# Patient Record
Sex: Female | Born: 1945
Health system: Southern US, Community
[De-identification: ages and names within clinical notes are randomized; demographics above are authoritative.]

## PROBLEM LIST (undated history)

## (undated) ENCOUNTER — Emergency Department (HOSPITAL_COMMUNITY)
Admission: EM | Disposition: A | Discharge status: Other Acute Inpatient Hospital | Payer: Medicare PPO | Attending: Surgery | Admitting: Surgery

## (undated) DIAGNOSIS — G43909 Migraine, unspecified, not intractable, without status migrainosus: Secondary | ICD-10-CM

## (undated) DIAGNOSIS — F419 Anxiety disorder, unspecified: Secondary | ICD-10-CM

## (undated) DIAGNOSIS — E785 Hyperlipidemia, unspecified: Secondary | ICD-10-CM

## (undated) DIAGNOSIS — M1990 Unspecified osteoarthritis, unspecified site: Secondary | ICD-10-CM

## (undated) DIAGNOSIS — C4490 Unspecified malignant neoplasm of skin, unspecified: Secondary | ICD-10-CM

## (undated) DIAGNOSIS — N6019 Diffuse cystic mastopathy of unspecified breast: Secondary | ICD-10-CM

## (undated) DIAGNOSIS — G4730 Sleep apnea, unspecified: Secondary | ICD-10-CM

## (undated) HISTORY — DX: Diffuse cystic mastopathy of unspecified breast: N60.19

## (undated) HISTORY — DX: Migraine, unspecified, not intractable, without status migrainosus: G43.909

## (undated) HISTORY — DX: Anxiety disorder, unspecified: F41.9

## (undated) HISTORY — DX: Hyperlipidemia, unspecified: E78.5

## (undated) HISTORY — PX: BREAST LUMPECTOMY: SHX2

## (undated) HISTORY — PX: TONSILLECTOMY: SUR1361

## (undated) HISTORY — DX: Sleep apnea, unspecified: G47.30

## (undated) HISTORY — DX: Unspecified malignant neoplasm of skin, unspecified: C44.90

## (undated) HISTORY — PX: KNEE SURGERY: SHX244

## (undated) HISTORY — DX: Unspecified osteoarthritis, unspecified site: M19.90

---

## 1997-06-26 HISTORY — PX: COLONOSCOPY: SHX174

## 1998-06-09 ENCOUNTER — Other Ambulatory Visit: Admission: RE | Admit: 1998-06-09 | Discharge: 1998-06-09 | Payer: Self-pay | Admitting: Obstetrics and Gynecology

## 1999-07-08 ENCOUNTER — Other Ambulatory Visit: Admission: RE | Admit: 1999-07-08 | Discharge: 1999-07-08 | Payer: Self-pay | Admitting: Obstetrics and Gynecology

## 1999-10-12 ENCOUNTER — Encounter: Payer: Self-pay | Admitting: *Deleted

## 1999-10-12 ENCOUNTER — Ambulatory Visit (HOSPITAL_COMMUNITY): Admission: RE | Admit: 1999-10-12 | Discharge: 1999-10-12 | Payer: Self-pay | Admitting: *Deleted

## 2000-04-06 ENCOUNTER — Encounter: Payer: Self-pay | Admitting: Internal Medicine

## 2000-04-06 ENCOUNTER — Ambulatory Visit (HOSPITAL_COMMUNITY): Admission: RE | Admit: 2000-04-06 | Discharge: 2000-04-06 | Payer: Self-pay | Admitting: Internal Medicine

## 2000-08-14 ENCOUNTER — Other Ambulatory Visit: Admission: RE | Admit: 2000-08-14 | Discharge: 2000-08-14 | Payer: Self-pay | Admitting: Obstetrics and Gynecology

## 2001-08-16 ENCOUNTER — Other Ambulatory Visit: Admission: RE | Admit: 2001-08-16 | Discharge: 2001-08-16 | Payer: Self-pay | Admitting: Obstetrics & Gynecology

## 2002-09-04 ENCOUNTER — Other Ambulatory Visit: Admission: RE | Admit: 2002-09-04 | Discharge: 2002-09-04 | Payer: Self-pay | Admitting: Obstetrics and Gynecology

## 2003-09-09 ENCOUNTER — Other Ambulatory Visit: Admission: RE | Admit: 2003-09-09 | Discharge: 2003-09-09 | Payer: Self-pay | Admitting: Obstetrics and Gynecology

## 2004-09-09 ENCOUNTER — Other Ambulatory Visit: Admission: RE | Admit: 2004-09-09 | Discharge: 2004-09-09 | Payer: Self-pay | Admitting: Obstetrics and Gynecology

## 2005-05-16 ENCOUNTER — Ambulatory Visit: Payer: Self-pay | Admitting: Internal Medicine

## 2005-10-04 ENCOUNTER — Other Ambulatory Visit: Admission: RE | Admit: 2005-10-04 | Discharge: 2005-10-04 | Payer: Self-pay | Admitting: Obstetrics and Gynecology

## 2005-10-12 ENCOUNTER — Ambulatory Visit: Payer: Self-pay | Admitting: Internal Medicine

## 2005-11-21 ENCOUNTER — Ambulatory Visit: Payer: Self-pay | Admitting: Internal Medicine

## 2006-02-09 ENCOUNTER — Ambulatory Visit: Payer: Self-pay | Admitting: Internal Medicine

## 2006-05-04 ENCOUNTER — Ambulatory Visit: Payer: Self-pay | Admitting: Internal Medicine

## 2006-06-26 HISTORY — PX: BREAST REDUCTION SURGERY: SHX8

## 2006-09-25 ENCOUNTER — Ambulatory Visit: Payer: Self-pay | Admitting: Internal Medicine

## 2006-09-25 LAB — CONVERTED CEMR LAB
ALT: 26 units/L (ref 0–40)
AST: 30 units/L (ref 0–37)
Cholesterol: 164 mg/dL (ref 0–200)
Glucose, Bld: 89 mg/dL (ref 70–99)
HDL: 56.6 mg/dL (ref >39.0)
LDL Cholesterol: 83 mg/dL (ref 0–99)
Total CHOL/HDL Ratio: 2.9
Triglycerides: 122 mg/dL (ref 0–149)
VLDL: 24 mg/dL (ref 0–40)

## 2006-12-17 ENCOUNTER — Telehealth (INDEPENDENT_AMBULATORY_CARE_PROVIDER_SITE_OTHER): Payer: Self-pay | Admitting: *Deleted

## 2006-12-25 ENCOUNTER — Ambulatory Visit: Payer: Self-pay | Admitting: Internal Medicine

## 2006-12-25 DIAGNOSIS — L259 Unspecified contact dermatitis, unspecified cause: Secondary | ICD-10-CM

## 2007-03-14 ENCOUNTER — Telehealth (INDEPENDENT_AMBULATORY_CARE_PROVIDER_SITE_OTHER): Payer: Self-pay | Admitting: *Deleted

## 2007-03-20 ENCOUNTER — Encounter: Payer: Self-pay | Admitting: Internal Medicine

## 2007-04-01 ENCOUNTER — Ambulatory Visit: Payer: Self-pay | Admitting: Internal Medicine

## 2007-04-01 DIAGNOSIS — E782 Mixed hyperlipidemia: Secondary | ICD-10-CM | POA: Insufficient documentation

## 2007-04-01 DIAGNOSIS — F329 Major depressive disorder, single episode, unspecified: Secondary | ICD-10-CM

## 2007-04-24 ENCOUNTER — Telehealth (INDEPENDENT_AMBULATORY_CARE_PROVIDER_SITE_OTHER): Payer: Self-pay | Admitting: *Deleted

## 2007-09-25 ENCOUNTER — Telehealth (INDEPENDENT_AMBULATORY_CARE_PROVIDER_SITE_OTHER): Payer: Self-pay | Admitting: *Deleted

## 2007-10-29 ENCOUNTER — Telehealth (INDEPENDENT_AMBULATORY_CARE_PROVIDER_SITE_OTHER): Payer: Self-pay | Admitting: *Deleted

## 2008-03-23 ENCOUNTER — Telehealth (INDEPENDENT_AMBULATORY_CARE_PROVIDER_SITE_OTHER): Payer: Self-pay | Admitting: *Deleted

## 2008-04-21 ENCOUNTER — Encounter: Payer: Self-pay | Admitting: Internal Medicine

## 2008-04-22 ENCOUNTER — Ambulatory Visit: Payer: Self-pay | Admitting: Internal Medicine

## 2008-04-22 DIAGNOSIS — J309 Allergic rhinitis, unspecified: Secondary | ICD-10-CM | POA: Insufficient documentation

## 2008-04-22 DIAGNOSIS — G43909 Migraine, unspecified, not intractable, without status migrainosus: Secondary | ICD-10-CM | POA: Insufficient documentation

## 2008-04-22 LAB — CONVERTED CEMR LAB
Cholesterol, target level: 200 mg/dL
HDL goal, serum: 50 mg/dL
LDL Goal: 100 mg/dL

## 2008-04-30 ENCOUNTER — Ambulatory Visit: Payer: Self-pay | Admitting: Internal Medicine

## 2008-05-02 LAB — CONVERTED CEMR LAB
ALT: 24 units/L (ref 0–35)
AST: 29 units/L (ref 0–37)
Albumin: 4 g/dL (ref 3.5–5.2)
Alkaline Phosphatase: 59 units/L (ref 39–117)
BUN: 19 mg/dL (ref 6–23)
Basophils Absolute: 0 10*3/uL (ref 0.0–0.1)
Basophils Relative: 0.1 % (ref 0.0–3.0)
Bilirubin, Direct: 0.1 mg/dL (ref 0.0–0.3)
CO2: 30 meq/L (ref 19–32)
Calcium: 9 mg/dL (ref 8.4–10.5)
Chloride: 106 meq/L (ref 96–112)
Cholesterol: 165 mg/dL (ref 0–200)
Creatinine, Ser: 0.8 mg/dL (ref 0.4–1.2)
Eosinophils Absolute: 0.2 10*3/uL (ref 0.0–0.7)
Eosinophils Relative: 3.4 % (ref 0.0–5.0)
GFR calc Af Amer: 93 mL/min
GFR calc non Af Amer: 77 mL/min
Glucose, Bld: 87 mg/dL (ref 70–99)
HCT: 40.3 % (ref 36.0–46.0)
HDL: 47.7 mg/dL (ref >39.0)
Hemoglobin: 14.2 g/dL (ref 12.0–15.0)
LDL Cholesterol: 91 mg/dL (ref 0–99)
Lymphocytes Relative: 35.9 % (ref 12.0–46.0)
MCHC: 35.2 g/dL (ref 30.0–36.0)
MCV: 97.5 fL (ref 78.0–100.0)
Monocytes Absolute: 0.5 10*3/uL (ref 0.1–1.0)
Monocytes Relative: 11.4 % (ref 3.0–12.0)
Neutro Abs: 2.3 10*3/uL (ref 1.4–7.7)
Neutrophils Relative %: 49.2 % (ref 43.0–77.0)
Platelets: 211 10*3/uL (ref 150–400)
Potassium: 4.3 meq/L (ref 3.5–5.1)
RBC: 4.13 M/uL (ref 3.87–5.11)
RDW: 12.1 % (ref 11.5–14.6)
Sodium: 143 meq/L (ref 135–145)
TSH: 1.63 microintl units/mL (ref 0.35–5.50)
Total Bilirubin: 0.7 mg/dL (ref 0.3–1.2)
Total CHOL/HDL Ratio: 3.5
Total Protein: 6.7 g/dL (ref 6.0–8.3)
Triglycerides: 130 mg/dL (ref 0–149)
VLDL: 26 mg/dL (ref 0–40)
WBC: 4.7 10*3/uL (ref 4.5–10.5)

## 2008-05-04 ENCOUNTER — Encounter (INDEPENDENT_AMBULATORY_CARE_PROVIDER_SITE_OTHER): Payer: Self-pay | Admitting: *Deleted

## 2008-07-02 ENCOUNTER — Ambulatory Visit: Payer: Self-pay | Admitting: Internal Medicine

## 2008-07-02 ENCOUNTER — Encounter (INDEPENDENT_AMBULATORY_CARE_PROVIDER_SITE_OTHER): Payer: Self-pay | Admitting: *Deleted

## 2008-07-02 LAB — CONVERTED CEMR LAB
OCCULT 1: NEGATIVE
OCCULT 2: NEGATIVE
OCCULT 3: NEGATIVE

## 2009-02-05 ENCOUNTER — Telehealth (INDEPENDENT_AMBULATORY_CARE_PROVIDER_SITE_OTHER): Payer: Self-pay | Admitting: *Deleted

## 2009-02-09 ENCOUNTER — Encounter: Payer: Self-pay | Admitting: Internal Medicine

## 2009-02-24 ENCOUNTER — Ambulatory Visit: Payer: Self-pay | Admitting: Internal Medicine

## 2009-04-13 ENCOUNTER — Telehealth (INDEPENDENT_AMBULATORY_CARE_PROVIDER_SITE_OTHER): Payer: Self-pay | Admitting: *Deleted

## 2009-04-14 ENCOUNTER — Encounter (INDEPENDENT_AMBULATORY_CARE_PROVIDER_SITE_OTHER): Payer: Self-pay | Admitting: *Deleted

## 2009-04-23 ENCOUNTER — Telehealth (INDEPENDENT_AMBULATORY_CARE_PROVIDER_SITE_OTHER): Payer: Self-pay | Admitting: *Deleted

## 2009-05-31 ENCOUNTER — Ambulatory Visit: Payer: Self-pay | Admitting: Internal Medicine

## 2009-05-31 DIAGNOSIS — L659 Nonscarring hair loss, unspecified: Secondary | ICD-10-CM | POA: Insufficient documentation

## 2009-06-08 ENCOUNTER — Encounter: Payer: Self-pay | Admitting: Internal Medicine

## 2009-06-17 ENCOUNTER — Ambulatory Visit: Payer: Self-pay | Admitting: Internal Medicine

## 2009-06-21 ENCOUNTER — Encounter (INDEPENDENT_AMBULATORY_CARE_PROVIDER_SITE_OTHER): Payer: Self-pay | Admitting: *Deleted

## 2009-06-21 LAB — CONVERTED CEMR LAB
ALT: 20 units/L (ref 0–35)
AST: 26 units/L (ref 0–37)
Albumin: 3.8 g/dL (ref 3.5–5.2)
Alkaline Phosphatase: 63 units/L (ref 39–117)
BUN: 12 mg/dL (ref 6–23)
Bilirubin, Direct: 0.1 mg/dL (ref 0.0–0.3)
CO2: 28 meq/L (ref 19–32)
Calcium: 9 mg/dL (ref 8.4–10.5)
Chloride: 106 meq/L (ref 96–112)
Cholesterol: 159 mg/dL (ref 0–200)
Creatinine, Ser: 0.8 mg/dL (ref 0.4–1.2)
GFR calc non Af Amer: 76.74 mL/min (ref >60)
Glucose, Bld: 90 mg/dL (ref 70–99)
HDL: 62.6 mg/dL (ref >39.00)
LDL Cholesterol: 77 mg/dL (ref 0–99)
Potassium: 3.9 meq/L (ref 3.5–5.1)
Sodium: 142 meq/L (ref 135–145)
TSH: 2.14 microintl units/mL (ref 0.35–5.50)
Total Bilirubin: 0.7 mg/dL (ref 0.3–1.2)
Total CHOL/HDL Ratio: 3
Total Protein: 6.5 g/dL (ref 6.0–8.3)
Triglycerides: 97 mg/dL (ref 0.0–149.0)
VLDL: 19.4 mg/dL (ref 0.0–40.0)

## 2009-06-22 ENCOUNTER — Ambulatory Visit: Payer: Self-pay | Admitting: Internal Medicine

## 2009-06-22 DIAGNOSIS — J019 Acute sinusitis, unspecified: Secondary | ICD-10-CM

## 2009-07-26 ENCOUNTER — Telehealth (INDEPENDENT_AMBULATORY_CARE_PROVIDER_SITE_OTHER): Payer: Self-pay | Admitting: *Deleted

## 2009-10-25 ENCOUNTER — Telehealth (INDEPENDENT_AMBULATORY_CARE_PROVIDER_SITE_OTHER): Payer: Self-pay | Admitting: *Deleted

## 2009-11-24 ENCOUNTER — Encounter: Payer: Self-pay | Admitting: Internal Medicine

## 2010-07-26 NOTE — Progress Notes (Signed)
Summary: Refill Request  Phone Note Refill Request Call back at [phone] Message from:  Pharmacy on Oct 25, 2009 8:54 AM  Refills Requested: Medication #1:  ATENOLOL 50 MG  TABS 1 by mouth qd   Dosage confirmed as above?Dosage Confirmed   Supply Requested: 3 months [facility]  Next Appointment Scheduled: none Initial call taken by: Harold Barban,  Oct 25, 2009 8:54 AM    Prescriptions: ATENOLOL 50 MG  TABS (ATENOLOL) 1 by mouth qd  #90 x 1   Entered by:   Shonna Chock   Authorized by:   Marga Melnick MD   Signed by:   Shonna Chock on 10/25/2009   Method used:   Faxed to ...       [facility]* (mail-order)             ,          Ph: [phone]       Fax: [phone]   RxID:   [id number]

## 2010-07-26 NOTE — Progress Notes (Signed)
Summary: REFILL  Phone Note Refill Request Message from:  Fax from Pharmacy on [facility] FAX [phone]  Refills Requested: Medication #1:  VYTORIN 10-40 MG  TABS 1 by mouth qd Initial call taken by: Barb Merino,  July 26, 2009 10:26 AM    Prescriptions: VYTORIN 10-40 MG  TABS (EZETIMIBE-SIMVASTATIN) 1 by mouth qd  #90 x 3   Entered by:   Shonna Chock   Authorized by:   Marga Melnick MD   Signed by:   Shonna Chock on 07/26/2009   Method used:   Faxed to ...       [facility]* (mail-order)             ,          Ph: [phone]       Fax: [phone]   RxID:   [id number]

## 2010-09-09 ENCOUNTER — Encounter: Payer: Self-pay | Admitting: Internal Medicine

## 2010-09-10 ENCOUNTER — Encounter: Payer: Self-pay | Admitting: Internal Medicine

## 2010-09-14 ENCOUNTER — Encounter: Payer: Self-pay | Admitting: Internal Medicine

## 2010-09-14 ENCOUNTER — Ambulatory Visit (INDEPENDENT_AMBULATORY_CARE_PROVIDER_SITE_OTHER): Payer: Medicare Other | Admitting: Internal Medicine

## 2010-09-14 VITALS — BP 100/64 | HR 80 | Ht 65.75 in | Wt 143.8 lb

## 2010-09-14 DIAGNOSIS — F419 Anxiety disorder, unspecified: Secondary | ICD-10-CM | POA: Insufficient documentation

## 2010-09-14 DIAGNOSIS — M79675 Pain in left toe(s): Secondary | ICD-10-CM

## 2010-09-14 DIAGNOSIS — Z8249 Family history of ischemic heart disease and other diseases of the circulatory system: Secondary | ICD-10-CM

## 2010-09-14 DIAGNOSIS — F411 Generalized anxiety disorder: Secondary | ICD-10-CM

## 2010-09-14 DIAGNOSIS — E785 Hyperlipidemia, unspecified: Secondary | ICD-10-CM

## 2010-09-14 DIAGNOSIS — Z0000 Encounter for general adult medical examination without abnormal findings: Secondary | ICD-10-CM

## 2010-09-14 DIAGNOSIS — M79609 Pain in unspecified limb: Secondary | ICD-10-CM

## 2010-09-14 MED ORDER — BUPROPION HCL ER (XL) 150 MG PO TB24: 150.0000 mg | ORAL_TABLET | Freq: Every day | ORAL | Status: DC

## 2010-09-14 MED ORDER — ALPRAZOLAM 0.25 MG PO TABS: 0.2500 mg | ORAL_TABLET | Freq: Every evening | ORAL | Status: AC | PRN

## 2010-09-14 NOTE — Progress Notes (Signed)
Subjective:    Patient ID: Dana Caldwell, female    DOB: 03/27/1946, 65 y.o.   MRN: [id number]  HPI  She is here for her Medicare wellness exam and medication refills. Her only symptom at this time is mild anxiety exacerbated by using her computer. She has had  intermittent pain in the left great toe; she questions an ingrown toenail.   The questions required for the Medicare wellness exam were completed in toto.    She will drink occasionally; she has never smoked. She is physically active walking 3-4 times a week. She also has been engaged in yoga. There are no limitations to activities of daily living. She has no significant balance issues. She denies significant depression. There were is no significant hearing or vision deficit issues.  She drinks one cup of coffee or tea a day often it is decaffeinated. Her most recent foreign travel was to [place] in November of 2010. She wears her seatbelt when driving or riding in the car. Smoke alarms are present in the house. Dental care is up to date. She is unsure as to the status of her colonoscopy . She has not had a bone density; Dr. Tenny Craw feels it is not necessary as she is on Evista. She believes that she may have been diagnosed with vitamin D deficiency.  Healthcare power of attorney and living will are in place.   Vision was intact based on reading a wall chart. Hearing was normal to whisper at 6 feet.   Orientation ; memory and recall; and mood and affect were normal. She was able to spell the word world backwards.    Review of Systems:  Pertinent review of systems was addressed in reference to her statin therapy. She denies fatigue; muscle pain; flushing; itching; pedal edema; chest pain; syncope; claudication; abdominal pain; or diarrhea. She does have occasional constipation.    She describes intermittent sleep disruption but no chronic insomnia.    [facility]. The triggers for  her migraines are weather changes.    She has had an extensive cardiology evaluation by Dr. Tresa Endo. She will request those records for review. She is unsure whether this included advance cholesterol testing. Her advanced cholesterol testing completed in 2007 was reviewed. At that time her LDL was 222 (3721 TOTAL PARTICLES; 2730 SMALL DENSE PARTICLES ) , HDL 52 , and triglycerides 176. LDL goal = < 115.     Objective:   Physical Exam  Constitutional: She is oriented to person, place, and time. She appears well-nourished.  HENT:  Head: Normocephalic.  Right Ear: External ear normal.  Left Ear: External ear normal.  Nose: Nose normal.  Mouth/Throat: Oropharynx is clear and moist.        Septal dislocation is present.  Eyes: Conjunctivae are normal. Pupils are equal, round, and reactive to light.  Neck: Neck supple. No thyromegaly present.  Pulmonary/Chest: Effort normal and breath sounds normal.  Abdominal: Soft. Bowel sounds are normal. She exhibits no mass. There is no tenderness.  Musculoskeletal: Normal range of motion. She exhibits no edema.  Lymphadenopathy:    She has no cervical adenopathy.  Neurological: She is alert and oriented to person, place, and time. She has normal reflexes.  Skin: Skin is dry.        The toenail of the left great toe is slightly ingrown. There is no active cellulitis present.  Psychiatric: She has a normal mood and affect.  Assessment & Plan:   #1 preventive health care exam reveals no significant acute issues.     #2 toe pain most likely related to ingrown toenail. Nail hygiene was discussed.   #3 anxiety in the context of using a computer( major issue in  computer malfunction at times)    #4 hyperlipidemia in the context of a family history of premature coronary artery disease. Additional advanced cholesterol testing Dana Caldwell to seize often hard panel 1304X) would be recommended if this has not been completed by Dr. Tresa Endo.     #5 possible  vitamin D deficiency    #6 unknown status as to colonoscopy surveillance status.

## 2010-09-14 NOTE — Patient Instructions (Signed)
If an advanced cholesterol test was not performed by Dr Tresa Endo; I would recommend the [facility] heart panel  be completed. Annual measurement of lipids and hepatic function (liver) would be indicated. If Dr. Tenny Craw has not checked your vitamin D level this would be indicated because of a history of possible vitamin D deficiency. You should be taking calcium 600 mg twice a day and at least 1000 international units of vitamin D 3 daily. Please contact Dr. Verlee Monte Caldwell's office to see when a follow up colonoscopy is due.

## 2010-09-21 ENCOUNTER — Encounter: Payer: Self-pay | Admitting: Internal Medicine

## 2011-01-12 ENCOUNTER — Other Ambulatory Visit: Payer: Self-pay | Admitting: Internal Medicine

## 2011-01-12 MED ORDER — EZETIMIBE-SIMVASTATIN 10-40 MG PO TABS: 1.0000 | ORAL_TABLET | Freq: Every day | ORAL | Status: DC

## 2011-01-12 NOTE — Telephone Encounter (Signed)
DUPLICATE

## 2011-01-12 NOTE — Telephone Encounter (Signed)
Called rx in, error in e-prescribing

## 2011-01-12 NOTE — Telephone Encounter (Signed)
Spoke with patient's husband and he indicated he thinks patient had cholesterol testing by [facility] and will have her contact them and fax results to our office.

## 2011-01-13 ENCOUNTER — Other Ambulatory Visit: Payer: BC Managed Care – PPO

## 2011-02-10 ENCOUNTER — Other Ambulatory Visit: Payer: Self-pay | Admitting: Internal Medicine

## 2011-02-10 DIAGNOSIS — E785 Hyperlipidemia, unspecified: Secondary | ICD-10-CM

## 2011-02-13 ENCOUNTER — Other Ambulatory Visit (INDEPENDENT_AMBULATORY_CARE_PROVIDER_SITE_OTHER): Payer: Medicare Other

## 2011-02-13 DIAGNOSIS — E785 Hyperlipidemia, unspecified: Secondary | ICD-10-CM

## 2011-02-13 LAB — HEPATIC FUNCTION PANEL
ALT: 25 U/L (ref 0–35)
Total Bilirubin: 0.5 mg/dL (ref 0.3–1.2)
Total Protein: 6.7 g/dL (ref 6.0–8.3)

## 2011-02-13 NOTE — Progress Notes (Signed)
Labs only

## 2011-03-08 ENCOUNTER — Encounter: Payer: Self-pay | Admitting: Internal Medicine

## 2011-03-21 ENCOUNTER — Ambulatory Visit (INDEPENDENT_AMBULATORY_CARE_PROVIDER_SITE_OTHER): Payer: Medicare Other | Admitting: Internal Medicine

## 2011-03-21 ENCOUNTER — Encounter: Payer: Self-pay | Admitting: Internal Medicine

## 2011-03-21 DIAGNOSIS — G4730 Sleep apnea, unspecified: Secondary | ICD-10-CM

## 2011-03-21 DIAGNOSIS — Z8249 Family history of ischemic heart disease and other diseases of the circulatory system: Secondary | ICD-10-CM

## 2011-03-21 DIAGNOSIS — E782 Mixed hyperlipidemia: Secondary | ICD-10-CM

## 2011-03-21 NOTE — Progress Notes (Signed)
  Subjective:    Patient ID: Dana Caldwell, female    DOB: March 27, 1946, 65 y.o.   MRN: [id number]  HPI Dyslipidemia assessment: Prior Advanced Lipid Testing:  ? NMR Lipoprofile in past, no copy in old chart.   Family history of premature CAD/ MI: 2 brothers had MI (12 & 23). P uncle MI > 55 .  Nutrition: no plan .  Exercise: yoga, walking up to 5 mpd 4-5X/ week . Diabetes : no, A1c 5.3% . HTN: no. Smoking history  : never .   Weight :  Stable Thyroid status: TSH 1.56.  Lab results reviewed :[facility] Heart Panel: not hyperabsorber;LDL 95;HDL 75;small dense LDL 28 (ideal < 20);Lp(a) 55 (ideal < 20)    Review of Systems ROS: fatigue: no ; chest pain : no ;claudication: no; palpitations: no; abd pain/bowel changes: occasional constipation ; myalgias:no;  syncope : no ; memory loss: no;skin changes: no.     Objective:   Physical Exam General appearance is one of good health and nourishment w/o distress.  Eyes: No conjunctival inflammation ; fundi normal  Neck: thyroid normal  Heart:  Normal rate and regular rhythm. S1 and S2 normal without gallop, murmur, click, rub .S4  Lungs:Chest clear to auscultation; no wheezes, rhonchi,rales ,or rubs present.No increased work of breathing.   Abdomen: bowel sounds normal, soft and non-tender without masses, organomegaly or hernias noted.  No AAA or bruits  Skin:Warm & dry.  Intact without suspicious lesions or rashes   Post traumatic changes 3 rd L toe nail   Neuro/Psych: focused & intelligent             Assessment & Plan:  #1 dyslipidemia; risks outlined above. Major risk is the Lp(a) of 55. Niacin (non-flush free) 500 mg would be only option  #2 sleep apnea, prescription for supplies.

## 2011-03-21 NOTE — Patient Instructions (Signed)
The Lp(a) is a genetic risk for premature heart disease which is not associated with LDL risk factors. The only option to treat this would be niacin. This B vitamin is a vasodilator and would be expected to open up the blood vessels and cause flushing. This can be prevented by taking it with several tablespoons of apple sauce. The pectin in applesauce blocks the flushing. There are prescription forms of niacin; check with your insurance company to see whether these are options based on cost  & coverage.

## 2011-03-22 ENCOUNTER — Ambulatory Visit: Payer: BC Managed Care – PPO | Admitting: Internal Medicine

## 2011-03-24 ENCOUNTER — Telehealth: Payer: Self-pay | Admitting: Internal Medicine

## 2011-03-24 MED ORDER — NIACIN ER (ANTIHYPERLIPIDEMIC) 500 MG PO TBCR: EXTENDED_RELEASE_TABLET | ORAL | Status: DC

## 2011-03-24 NOTE — Telephone Encounter (Signed)
Niaspan 500 mg each evening with apple sauce as directed. #90, RX 1

## 2011-03-24 NOTE — Telephone Encounter (Signed)
Dr.Hopper please advise, Niaspan was recommended at last OV, patient checked with insurance company.

## 2011-03-24 NOTE — Telephone Encounter (Signed)
Please take me out of your list ---I/m getting everything you send out---thanks

## 2011-03-24 NOTE — Telephone Encounter (Signed)
RX sent

## 2011-03-24 NOTE — Telephone Encounter (Signed)
Patient did not have NMR lipid profile, patient is requesting Niaspan 90 day supply to go to [facility]

## 2011-04-26 ENCOUNTER — Encounter: Payer: BC Managed Care – PPO | Admitting: Internal Medicine

## 2011-04-27 ENCOUNTER — Encounter: Payer: Self-pay | Admitting: Internal Medicine

## 2011-04-30 ENCOUNTER — Other Ambulatory Visit: Payer: Self-pay | Admitting: Internal Medicine

## 2011-06-27 HISTORY — PX: COLONOSCOPY W/ POLYPECTOMY: SHX1380

## 2011-09-27 ENCOUNTER — Other Ambulatory Visit: Payer: Self-pay | Admitting: Internal Medicine

## 2011-09-27 NOTE — Telephone Encounter (Signed)
Error

## 2011-09-27 NOTE — Telephone Encounter (Signed)
Refill for Niacin (Antihyperlipidemic) (Tab CR) NIASPAN 500 MG Daily at bedtime with applesauce (low-fat snack) as directed Patient took mail order bottle to pharmacy & had pharmacy call here. Please send to [facility] on [address]

## 2011-10-02 ENCOUNTER — Encounter: Payer: Self-pay | Admitting: Internal Medicine

## 2011-10-02 ENCOUNTER — Telehealth: Payer: Self-pay | Admitting: Internal Medicine

## 2011-10-02 MED ORDER — NIACIN ER (ANTIHYPERLIPIDEMIC) 500 MG PO TBCR: EXTENDED_RELEASE_TABLET | ORAL | Status: DC

## 2011-10-02 NOTE — Telephone Encounter (Signed)
RX sent

## 2011-10-02 NOTE — Telephone Encounter (Signed)
Patient would like refill of niaspan [facility] to [facility] on [address].  Cell [phone]

## 2011-11-02 ENCOUNTER — Ambulatory Visit (AMBULATORY_SURGERY_CENTER): Payer: Medicare Other | Admitting: *Deleted

## 2011-11-02 VITALS — Ht 66.0 in | Wt 144.6 lb

## 2011-11-02 DIAGNOSIS — Z1211 Encounter for screening for malignant neoplasm of colon: Secondary | ICD-10-CM

## 2011-11-02 MED ORDER — PEG-KCL-NACL-NASULF-NA ASC-C 100 G PO SOLR: ORAL | Status: DC

## 2011-11-16 ENCOUNTER — Ambulatory Visit (AMBULATORY_SURGERY_CENTER): Payer: Medicare Other | Admitting: Internal Medicine

## 2011-11-16 ENCOUNTER — Encounter: Payer: Self-pay | Admitting: Internal Medicine

## 2011-11-16 VITALS — BP 134/62 | HR 85 | Temp 97.4°F | Resp 18 | Ht 66.0 in | Wt 144.0 lb

## 2011-11-16 DIAGNOSIS — K635 Polyp of colon: Secondary | ICD-10-CM

## 2011-11-16 DIAGNOSIS — D126 Benign neoplasm of colon, unspecified: Secondary | ICD-10-CM

## 2011-11-16 DIAGNOSIS — Z1211 Encounter for screening for malignant neoplasm of colon: Secondary | ICD-10-CM

## 2011-11-16 MED ORDER — SODIUM CHLORIDE 0.9 % IV SOLN: 500.0000 mL | INTRAVENOUS | Status: DC

## 2011-11-16 NOTE — Patient Instructions (Signed)
YOU HAD AN ENDOSCOPIC PROCEDURE TODAY AT THE [facility]: Refer to the procedure report that was given to you for any specific questions about what was found during the examination.  If the procedure report does not answer your questions, please call your gastroenterologist to clarify.  If you requested that your care partner not be given the details of your procedure findings, then the procedure report has been included in a sealed envelope for you to review at your convenience later.  YOU SHOULD EXPECT: Some feelings of bloating in the abdomen. Passage of more gas than usual.  Walking can help get rid of the air that was put into your GI tract during the procedure and reduce the bloating. If you had a lower endoscopy (such as a colonoscopy or flexible sigmoidoscopy) you may notice spotting of blood in your stool or on the toilet paper. If you underwent a bowel prep for your procedure, then you may not have a normal bowel movement for a few days.  DIET: Your first meal following the procedure should be a light meal and then it is ok to progress to your normal diet.  A half-sandwich or bowl of soup is an example of a good first meal.  Heavy or fried foods are harder to digest and may make you feel nauseous or bloated.  Likewise meals heavy in dairy and vegetables can cause extra gas to form and this can also increase the bloating.  Drink plenty of fluids but you should avoid alcoholic beverages for 24 hours.  ACTIVITY: Your care partner should take you home directly after the procedure.  You should plan to take it easy, moving slowly for the rest of the day.  You can resume normal activity the day after the procedure however you should NOT DRIVE or use heavy machinery for 24 hours (because of the sedation medicines used during the test).    SYMPTOMS TO REPORT IMMEDIATELY: A gastroenterologist can be reached at any hour.  During normal business hours, 8:30 AM to 5:00 PM Monday through Friday,  call [phone].  After hours and on weekends, please call the GI answering service at [phone] who will take a message and have the physician on call contact you.   Following lower endoscopy (colonoscopy or flexible sigmoidoscopy):  Excessive amounts of blood in the stool  Significant tenderness or worsening of abdominal pains  Swelling of the abdomen that is new, acute  Fever of 100F or higher   FOLLOW UP: If any biopsies were taken you will be contacted by phone or by letter within the next 1-3 weeks.  Call your gastroenterologist if you have not heard about the biopsies in 3 weeks.  Our staff will call the home number listed on your records the next business day following your procedure to check on you and address any questions or concerns that you may have at that time regarding the information given to you following your procedure. This is a courtesy call and so if there is no answer at the home number and we have not heard from you through the emergency physician on call, we will assume that you have returned to your regular daily activities without incident.  SIGNATURES/CONFIDENTIALITY: You and/or your care partner have signed paperwork which will be entered into your electronic medical record.  These signatures attest to the fact that that the information above on your After Visit Summary has been reviewed and is understood.  Full responsibility of the confidentiality of   this discharge information lies with you and/or your care-partner.   Resume medications. Information given on polyps and high fiber diet with discharge instructions. 

## 2011-11-16 NOTE — Progress Notes (Signed)
Patient did not experience any of the following events: a burn prior to discharge; a fall within the facility; wrong site/side/patient/procedure/implant event; or a hospital transfer or hospital admission upon discharge from the facility. (G8907) Patient did not have preoperative order for IV antibiotic SSI prophylaxis. (G8918)  

## 2011-11-16 NOTE — Progress Notes (Signed)
Patient questioning medication dosages on AVS. Explained to her that I was aware of her discussion with me that some of the medications were taken as needed, and I charted when they were last taken. If I changed how she takes them in the computer, it changes the prescription and availability of medicines to her. She understands the difference of how the prescription is written and transmitted and how the medicines may be advised to be taken by her provider.

## 2011-11-16 NOTE — Op Note (Signed)
[facility] [address] [place], [place]  [address]  COLONOSCOPY PROCEDURE REPORT  PATIENT:  Dana Caldwell, Dana Caldwell  MR#:  [id number] BIRTHDATE:  06-May-1946, 65 yrs. old  GENDER:  female ENDOSCOPIST:  Hedwig Morton. Juanda Chance, MD REF. BY:  Marga Melnick, M.D. PROCEDURE DATE:  11/16/2011 PROCEDURE:  Colonoscopy with biopsy and snare polypectomy ASA CLASS:  Class II INDICATIONS:  Routine Risk Screening last colon 1999 was normal MEDICATIONS:   MAC sedation, administered by CRNA, propofol (Diprivan) 200 mg  DESCRIPTION OF PROCEDURE:   After the risks and benefits and of the procedure were explained, informed consent was obtained. Digital rectal exam was performed and revealed no rectal masses. The LB [id number]-[id number] endoscope was introduced through the anus and advanced to the cecum, which was identified by both the appendix and ileocecal valve.  The quality of the prep was good, using MoviPrep.  The instrument was then slowly withdrawn as the colon was fully examined. <<PROCEDUREIMAGES>>  FINDINGS:  A sessile polyp was found. 5 mm sessile polyp distal from the ileocecal valve Polyp was snared without cautery. Retrieval was successful (see image3). snare polyp postpolyp bleeding stasis achived with hot biopsies  This was otherwise a normal examination of the colon (see image4, image3, image2, and image1).   Retroflexed views in the rectum revealed no abnormalities.    The scope was then withdrawn from the patient and the procedure completed.  COMPLICATIONS:  None ENDOSCOPIC IMPRESSION: 1) Sessile polyp 2) Otherwise normal examination RECOMMENDATIONS: 1) Await pathology results 2) High fiber diet.  REPEAT EXAM:  In 5 year(s) for.  ______________________________ Hedwig Morton. Juanda Chance, MD  CC:  n. eSIGNED:   Hedwig Morton. Alecsander Hattabaugh at 11/16/2011 10:32 AM  Laruth Bouchard, [phone]

## 2011-11-17 ENCOUNTER — Telehealth: Payer: Self-pay | Admitting: *Deleted

## 2011-11-17 NOTE — Telephone Encounter (Signed)
  Follow up Call-  Call back number 11/16/2011  Post procedure Call Back phone  # [phone]  Permission to leave phone message Yes     Left message.

## 2011-11-23 ENCOUNTER — Encounter: Payer: Self-pay | Admitting: Internal Medicine

## 2012-03-22 ENCOUNTER — Ambulatory Visit (INDEPENDENT_AMBULATORY_CARE_PROVIDER_SITE_OTHER): Payer: Medicare Other | Admitting: Internal Medicine

## 2012-03-22 ENCOUNTER — Encounter: Payer: Self-pay | Admitting: Internal Medicine

## 2012-03-22 ENCOUNTER — Other Ambulatory Visit: Payer: Self-pay | Admitting: Internal Medicine

## 2012-03-22 VITALS — BP 110/68 | HR 86 | Temp 98.3°F | Wt 136.8 lb

## 2012-03-22 DIAGNOSIS — Z23 Encounter for immunization: Secondary | ICD-10-CM

## 2012-03-22 DIAGNOSIS — L03019 Cellulitis of unspecified finger: Secondary | ICD-10-CM

## 2012-03-22 DIAGNOSIS — L0390 Cellulitis, unspecified: Secondary | ICD-10-CM

## 2012-03-22 DIAGNOSIS — L0291 Cutaneous abscess, unspecified: Secondary | ICD-10-CM

## 2012-03-22 MED ORDER — TRAMADOL HCL 50 MG PO TABS: 50.0000 mg | ORAL_TABLET | Freq: Four times a day (QID) | ORAL | Status: DC | PRN

## 2012-03-22 MED ORDER — CLARITHROMYCIN ER 500 MG PO TB24: 1000.0000 mg | ORAL_TABLET | Freq: Every day | ORAL | Status: DC

## 2012-03-22 MED ORDER — MUPIROCIN 2 % EX OINT: TOPICAL_OINTMENT | CUTANEOUS | Status: DC

## 2012-03-22 NOTE — Progress Notes (Signed)
  Subjective:    Patient ID: Dana Caldwell, female    DOB: 1946/01/17, 66 y.o.   MRN: [id number]  HPI Symptoms began 03/13/12 as pain at the distal left index finger after having her nails done. The pain began within 24 hours of the manicure. Subsequently she had increasing swelling and pain which necessitated being seen 9/21 in an  urgent care out of town. She was placed on doxycycline as well with Hibiclens sterilization twice a day.  She had treated this herself with alcohol, hydrogen peroxide, Neosporin, and Mercurochrome.      Review of Systems She denies fever, chills, sweats, purulent drainage or cellulitis     Objective:   Physical Exam  She appears healthy and well-nourished; she is uncomfortable due to 2 pain index finger  There is no lymphadenopathy about the neck, axilla, or epitrochlear areas.  Radial artery pulses are excellent  And abscess 8 x 11 mm is present along the radial aspect of the left index fingernail. No evidence of cellulitis  proximal to the  DIP was present  The abscess was cleansed with Betadine and alcohol. Sterile needle was used to open the wound. A collection of pus was obtained followed by some serosanguineous material.        Assessment & Plan:   #1 paronychia with abscess  Plan: See orders & recommendations

## 2012-03-22 NOTE — Telephone Encounter (Signed)
I called pharmacy back and left message informing them rx was sent to [address] [facility] in error ( system to be reviewed and rx's to be transferred), if unable to transfer I left the rx's to be filled (see med list)

## 2012-03-22 NOTE — Patient Instructions (Addendum)
Please do not take Cafergot or Vytorin while you're on the clarithromycin. Soak finger twice a day in saline. The saline can be purchased at the drugstore or you can make your own .Boil cup of salt in a gallon of water. Store mixture  in a clean container.Report Warning  signs as discussed (red streaks, pus, fever, increasing pain).

## 2012-03-22 NOTE — Telephone Encounter (Signed)
Pt has told pharm that a proscription should have arrived at the [facility] on [address]. Pharm calling they haven't received anything and are not sure what prescription she is talking about. They would like you to call them back at [phone]

## 2012-03-25 LAB — WOUND CULTURE: Gram Stain: NONE SEEN

## 2012-04-11 ENCOUNTER — Other Ambulatory Visit: Payer: Self-pay | Admitting: Internal Medicine

## 2012-04-26 ENCOUNTER — Ambulatory Visit (INDEPENDENT_AMBULATORY_CARE_PROVIDER_SITE_OTHER): Payer: Medicare Other | Admitting: Internal Medicine

## 2012-04-26 ENCOUNTER — Encounter: Payer: Self-pay | Admitting: Internal Medicine

## 2012-04-26 VITALS — BP 100/70 | HR 71 | Temp 98.0°F | Resp 12 | Ht 65.08 in | Wt 133.8 lb

## 2012-04-26 DIAGNOSIS — Z23 Encounter for immunization: Secondary | ICD-10-CM

## 2012-04-26 DIAGNOSIS — Z0000 Encounter for general adult medical examination without abnormal findings: Secondary | ICD-10-CM

## 2012-04-26 DIAGNOSIS — E785 Hyperlipidemia, unspecified: Secondary | ICD-10-CM

## 2012-04-26 DIAGNOSIS — D126 Benign neoplasm of colon, unspecified: Secondary | ICD-10-CM

## 2012-04-26 DIAGNOSIS — E782 Mixed hyperlipidemia: Secondary | ICD-10-CM

## 2012-04-26 DIAGNOSIS — K635 Polyp of colon: Secondary | ICD-10-CM | POA: Insufficient documentation

## 2012-04-26 DIAGNOSIS — E559 Vitamin D deficiency, unspecified: Secondary | ICD-10-CM | POA: Insufficient documentation

## 2012-04-26 LAB — BASIC METABOLIC PANEL
BUN: 16 mg/dL (ref 6–23)
Calcium: 8.9 mg/dL (ref 8.4–10.5)
Chloride: 104 mEq/L (ref 96–112)
Creatinine, Ser: 0.7 mg/dL (ref 0.4–1.2)

## 2012-04-26 LAB — LIPID PANEL
HDL: 93.1 mg/dL (ref >39.00)
LDL Cholesterol: 60 mg/dL (ref 0–99)
Total CHOL/HDL Ratio: 2
Triglycerides: 63 mg/dL (ref 0.0–149.0)
VLDL: 12.6 mg/dL (ref 0.0–40.0)

## 2012-04-26 LAB — CBC WITH DIFFERENTIAL/PLATELET
Eosinophils Absolute: 0.1 10*3/uL (ref 0.0–0.7)
Lymphocytes Relative: 38 % (ref 12.0–46.0)
MCHC: 33.4 g/dL (ref 30.0–36.0)
MCV: 96.7 fl (ref 78.0–100.0)
Monocytes Absolute: 0.3 10*3/uL (ref 0.1–1.0)
Neutrophils Relative %: 49.8 % (ref 43.0–77.0)
Platelets: 209 10*3/uL (ref 150.0–400.0)
RBC: 4.38 Mil/uL (ref 3.87–5.11)
WBC: 4 10*3/uL — ABNORMAL LOW (ref 4.5–10.5)

## 2012-04-26 LAB — HEPATIC FUNCTION PANEL
Alkaline Phosphatase: 58 U/L (ref 39–117)
Bilirubin, Direct: 0.1 mg/dL (ref 0.0–0.3)
Total Bilirubin: 0.5 mg/dL (ref 0.3–1.2)

## 2012-04-26 LAB — TSH: TSH: 0.41 u[IU]/mL (ref 0.35–5.50)

## 2012-04-26 NOTE — Patient Instructions (Addendum)
Preventive Health Care: Eat a low-fat diet with lots of fruits and vegetables, up to 7-9 servings per day. Consume less than 30 grams of sugar per day from foods & drinks with High Fructose Corn Syrup as #1,2,3 or #4 on label.  If you activate My Chart; the results can be released to you as soon as they populate from the lab. If you choose not to use this program; the labs have to be reviewed, copied & mailed   causing a delay in getting the results to you.

## 2012-04-26 NOTE — Progress Notes (Signed)
Subjective:    Patient ID: Dana Caldwell, female    DOB: 03-02-1946, 66 y.o.   MRN: [id number]  HPI Medicare Wellness Visit:  The following psychosocial & medical history were reviewed as required by Medicare.   Social history: caffeine: minimal , alcohol:  rarely,  tobacco use :never  & exercise : walking > 4 mpd 4X/ week.   Home & personal  safety / fall risk: see screen, activities of daily living: no limitations , seatbelt use : yes , and smoke alarm employment :yes .  Power of Attorney/Living Will status : in place  Vision ( as recorded per Nurse) & Hearing  evaluation : Ophth exam > 12 mos. No hearing exam. Orientation :oriented X 3 , memory & recall :good, spelling  testing: good,and mood & affect : normal . Depression / anxiety: denied Travel history : 2011 [place] , immunization status :Shingles needed , transfusion history: no, and preventive health surveillance ( colonoscopies, BMD , etc as per protocol/ [facility]): colonoscopy up to date, Dental care:  Every 6 mos . Chart reviewed &  Updated. Active issues reviewed & addressed.       Review of Systems    Exercise as noted above; she is on a heart healthy diet. She has no chest pain, palpitations, dyspnea, or claudication with exercise. She is compliant with her statin. She denies abdominal pain, bowel changes, or myalgias.  She has been seen at the headache clinic; her migraine regimen mus be adjusted as Cafergot which she's used in the past is not available, unless she has it compounded. She previously had taken Topamax but this had caused some decreased mental alertness. The neurologist questioned increase risk of stroke with triple therapy. There is family history of cerebrovascular accident.     Objective:   Physical Exam Gen.: Thin but healthy and well-nourished in appearance. Alert, appropriate and cooperative throughout exam. Head: Normocephalic without obvious abnormalities  Eyes: No corneal or conjunctival inflammation noted.   Extraocular motion intact. Vision grossly normal with lenses. Ears: External  ear exam reveals no significant lesions or deformities. Wax on R. L TM normal. Hearing is grossly normal bilaterally. Nose: External nasal exam reveals no deformity or inflammation. Nasal mucosa are pink and moist. No lesions or exudates noted. Septum slightly dislocated Mouth: Oral mucosa and oropharynx reveal no lesions or exudates. Teeth in good repair. Neck: No deformities, masses, or tenderness noted. Range of motion decreased. Thyroid  normal Lungs: Normal respiratory effort; chest expands symmetrically. Lungs are clear to auscultation without rales, wheezes, or increased work of breathing. Heart: Normal rate and rhythm. Normal S1 and S2. No gallop, click, or rub. Grade 1/2-1 over 6 systolic murmur . Abdomen: Bowel sounds normal; abdomen soft and nontender. No masses, organomegaly or hernias noted.Aorta palpable ; no AAA  Genitalia: Dr Tenny Craw                                               Musculoskeletal/extremities: No deformity or scoliosis noted of  the thoracic or lumbar spine. No clubbing, cyanosis, edema, or deformity noted. Range of motion  normal .Tone & strength  normal.Joints normal. Nail health  good. Vascular: Carotid, radial artery, dorsalis pedis and  posterior tibial pulses are full and equal. No bruits present. Neurologic: Alert and oriented x3. Deep tendon reflexes symmetrical and normal.  Skin: Intact without suspicious lesions or rashes. Lymph: No cervical, axillary lymphadenopathy present. Psych: Mood and affect are normal. Normally interactive                                                                                         Assessment & Plan:  #1 Medicare Wellness Exam; criteria met ; data entered #2 migraine regimen as per Dr Neale Burly Plan: see Orders

## 2012-04-29 ENCOUNTER — Other Ambulatory Visit: Payer: Self-pay | Admitting: Internal Medicine

## 2012-05-04 ENCOUNTER — Other Ambulatory Visit: Payer: Self-pay | Admitting: Internal Medicine

## 2012-05-19 ENCOUNTER — Other Ambulatory Visit: Payer: Self-pay | Admitting: Internal Medicine

## 2012-06-03 ENCOUNTER — Telehealth: Payer: Self-pay | Admitting: Internal Medicine

## 2012-06-03 NOTE — Telephone Encounter (Signed)
Caller: Millisa/Patient; Phone: [phone]; Reason for Call: Called to let Dr Alwyn Ren know that dentist, Dr Irene Limbo, DDS will be following her with new Moses appliance, instead of C-PAP machine.

## 2012-06-07 ENCOUNTER — Telehealth: Payer: Self-pay | Admitting: *Deleted

## 2012-06-07 MED ORDER — ZOSTER VACCINE LIVE 19400 UNT/0.65ML ~~LOC~~ SOLR: 0.6500 mL | Freq: Once | SUBCUTANEOUS | Status: DC

## 2012-06-07 NOTE — Telephone Encounter (Signed)
Ok

## 2012-06-07 NOTE — Telephone Encounter (Signed)
RX sent

## 2012-06-07 NOTE — Telephone Encounter (Signed)
Pt request a Rx for shingle vaccine to be sent in to pharmacy.

## 2012-06-21 ENCOUNTER — Encounter: Payer: Self-pay | Admitting: Internal Medicine

## 2012-09-23 ENCOUNTER — Encounter: Payer: Self-pay | Admitting: Vascular Surgery

## 2012-09-24 ENCOUNTER — Ambulatory Visit (INDEPENDENT_AMBULATORY_CARE_PROVIDER_SITE_OTHER): Payer: Medicare Other | Admitting: Vascular Surgery

## 2012-09-24 ENCOUNTER — Other Ambulatory Visit: Payer: Self-pay | Admitting: *Deleted

## 2012-09-24 ENCOUNTER — Encounter (INDEPENDENT_AMBULATORY_CARE_PROVIDER_SITE_OTHER): Payer: Medicare Other | Admitting: Vascular Surgery

## 2012-09-24 ENCOUNTER — Encounter: Payer: Self-pay | Admitting: Vascular Surgery

## 2012-09-24 VITALS — BP 116/63 | HR 80 | Resp 16 | Ht 66.0 in | Wt 130.0 lb

## 2012-09-24 DIAGNOSIS — I8390 Asymptomatic varicose veins of unspecified lower extremity: Secondary | ICD-10-CM

## 2012-09-24 DIAGNOSIS — I83892 Varicose veins of left lower extremities with other complications: Secondary | ICD-10-CM | POA: Insufficient documentation

## 2012-09-24 DIAGNOSIS — M79609 Pain in unspecified limb: Secondary | ICD-10-CM

## 2012-09-24 DIAGNOSIS — I83893 Varicose veins of bilateral lower extremities with other complications: Secondary | ICD-10-CM

## 2012-09-24 DIAGNOSIS — M7989 Other specified soft tissue disorders: Secondary | ICD-10-CM

## 2012-09-24 NOTE — Progress Notes (Signed)
Subjective:     Patient ID: Dana Caldwell, female   DOB: [birth year], 67 y.o.   MRN: [id number]  HPI this 67 year old female is evaluated for varicose veins left worse than right. She has had these for several years. The veins in the left calf and become more prominent and more symptomatic. She now has aching throbbing and burning discomfort which worsens as the day progresses. She does not elevate the legs nor use elastic compression stockings. She has no history of DVT or thrombophlebitis.  Past Medical History  Diagnosis Date  . Hyperlipidemia     NMR 2007: LDL 222(3721/2730), HDL 52,TG 176). LDL goal +  < 115  . Migraine headache     Dr Neale Burly  . Allergic rhinitis   . Anxiety disorder     controlled with meds  . Anxiety   . Arthritis     thumbs    History  Substance Use Topics  . Smoking status: Never Smoker   . Smokeless tobacco: Never Used  . Alcohol Use: 0.0 oz/week     Comment:  rarely    Family History  Problem Relation Age of Onset  . Stroke Father     > 70  . Multiple sclerosis Mother   . Heart attack Brother      two brothers;52& 62  . Stroke Maternal Grandfather 31  . Breast cancer Paternal Grandmother   . Coronary artery disease Brother     CBAG @ 62  . Diabetes Paternal Uncle   . Colon cancer Neg Hx   . Esophageal cancer Neg Hx   . Stomach cancer Neg Hx   . Rectal cancer Neg Hx   . Heart attack Paternal Uncle     in 67s    Allergies  Allergen Reactions  . Penicillins     Hives while on PCN AND Sulfa  . Sulfonamide Derivatives     Hives while on PCN AND Sulfa    Current outpatient prescriptions:ALPRAZolam (XANAX) 0.25 MG tablet, Take 0.25 mg by mouth at bedtime as needed. Takes 0/5 tablet as needed, Disp: , Rfl: ;  aspirin 81 MG tablet, Take 81 mg by mouth daily.  , Disp: , Rfl: ;  baclofen (LIORESAL) 10 MG tablet, Take 10 mg by mouth as needed. , Disp: , Rfl: ;  buPROPion (WELLBUTRIN XL) 150 MG 24 hr tablet, take 1 tablet by mouth once daily, Disp:  90 tablet, Rfl: 3 cholecalciferol (VITAMIN D) 1000 UNITS tablet, Take 1,000 Units by mouth daily.  , Disp: , Rfl: ;  Diclofenac Sodium (PENNSAID) 1.5 % SOLN, Place onto the skin as needed.  , Disp: , Rfl: ;  lidocaine (LIDODERM) 5 %, Place 1 patch onto the skin daily. Remove & Discard patch within 12 hours or as directed by MD , Disp: , Rfl: ;  Melatonin 5 MG TABS, Take 5 mg by mouth at bedtime.  , Disp: , Rfl:  naproxen (NAPROSYN) 250 MG tablet, Take 250 mg by mouth as needed. , Disp: , Rfl: ;  niacin (NIASPAN) 500 MG CR tablet, take 1 tablet by mouth at bedtime WITH APPLESAUCE, Disp: 90 tablet, Rfl: 1;  Omega-3 Fatty Acids (FISH OIL) 1000 MG CAPS, Take 1,000 mg by mouth 2 (two) times daily. , Disp: , Rfl: ;  raloxifene (EVISTA) 60 MG tablet, Take 60 mg by mouth daily.  , Disp: , Rfl:  VYTORIN 10-40 MG per tablet, take 1 tablet by mouth once daily, Disp: 90 each, Rfl: 3;  ergotamine-caffeine (  CAFERGOT) 1-100 MG per tablet, Take 2 tablets by mouth once. Two tablets at onset of attack; then 1 tablet every 30 minutes as needed; maximum: 6 tablets per attack; do not exceed 10 tablets/week , Disp: , Rfl: ;  loratadine (CLARITIN) 10 MG tablet, Take 10 mg by mouth daily.  , Disp: , Rfl:  Magnesium 500 MG TABS, Take 500 mg by mouth daily.  , Disp: , Rfl: ;  metoCLOPramide (REGLAN) 5 MG tablet, Take 5 mg by mouth as needed.  , Disp: , Rfl: ;  mupirocin ointment (BACTROBAN) 2 %, Applied twice a day to the affected area;NOT into eyes., Disp: 15 g, Rfl: 0;  rizatriptan (MAXALT) 5 MG tablet, Take 5 mg by mouth as needed. May repeat in 2 hours if needed , Disp: , Rfl:  traMADol (ULTRAM) 50 MG tablet, Take 1 tablet (50 mg total) by mouth every 6 (six) hours as needed for pain., Disp: 30 tablet, Rfl: 0;  zoster vaccine live, PF, (ZOSTAVAX) 69629 UNT/0.65ML injection, Inject 19,400 Units into the skin once., Disp: 1 each, Rfl: 0  BP 116/63  Pulse 80  Resp 16  Ht 5\' 6"  (1.676 m)  Wt 130 lb (58.968 kg)  BMI 20.99  kg/m2  Body mass index is 20.99 kg/(m^2).           Review of Systems chest pain, dyspnea on exertion, PND, orthopnea, hemoptysis, claudication     Objective:   Physical Exam blood pressure 116/63 heart rate 80 respirations 16 Gen.-alert and oriented x3 in no apparent distress HEENT normal for age Lungs no rhonchi or wheezing Cardiovascular regular rhythm no murmurs carotid pulses 3+ palpable no bruits audible Abdomen soft nontender no palpable masses Musculoskeletal free of  major deformities Skin clear -no rashes Neurologic normal Lower extremities 3+ femoral and dorsalis pedis pulses palpable bilaterally with no edema Left leg with prominent bulging varicosities in the medial calf extending posteriorly. No hyperpigmentation or ulceration is noted. Right leg has 1 patch and spider veins lateral to the knee with no bulging varicosities or distal edema.  Today I ordered bilateral venous duplex exam which I reviewed and interpreted. The left leg has an aberrant branch to the great saphenous vein running more anteriorly which does have reflux but it is not of large caliber. There is some reflux in the deep system on the left. Right leg has no significant reflux in the great saphenous system. The right small saphenous vein has mild reflux but is of small caliber.      Assessment:     Bulging symptomatic varicosities left  leg great saphenous system with some reflux in the left great saphenous system but not large caliber vein Ulcer deep vein reflux on the left side    Plan:     Best plan for patient will be stab phlebectomy (10-20) of left leg varicosities--she will consider that

## 2012-09-26 ENCOUNTER — Other Ambulatory Visit: Payer: Self-pay | Admitting: Obstetrics and Gynecology

## 2012-10-27 ENCOUNTER — Encounter: Payer: Self-pay | Admitting: Internal Medicine

## 2012-10-27 DIAGNOSIS — M8580 Other specified disorders of bone density and structure, unspecified site: Secondary | ICD-10-CM | POA: Insufficient documentation

## 2012-11-16 ENCOUNTER — Other Ambulatory Visit: Payer: Self-pay | Admitting: Internal Medicine

## 2013-01-02 ENCOUNTER — Ambulatory Visit (INDEPENDENT_AMBULATORY_CARE_PROVIDER_SITE_OTHER): Payer: Medicare Other | Admitting: Internal Medicine

## 2013-01-02 ENCOUNTER — Encounter: Payer: Self-pay | Admitting: Internal Medicine

## 2013-01-02 VITALS — BP 114/76 | HR 91 | Temp 98.1°F | Wt 127.0 lb

## 2013-01-02 DIAGNOSIS — J209 Acute bronchitis, unspecified: Secondary | ICD-10-CM

## 2013-01-02 MED ORDER — AZITHROMYCIN 250 MG PO TABS: ORAL_TABLET | ORAL | Status: DC

## 2013-01-02 MED ORDER — HYDROCODONE-HOMATROPINE 5-1.5 MG/5ML PO SYRP: 5.0000 mL | ORAL_SOLUTION | Freq: Four times a day (QID) | ORAL | Status: DC | PRN

## 2013-01-02 NOTE — Progress Notes (Signed)
  Subjective:    Patient ID: Dana Caldwell, female    DOB: [birth year], 67 y.o.   MRN: [id number]  HPI  She has returned from 25 day car trip across [place] which began 12/08/12. As of 6/28 she developed chest congestion and nonproductive cough. She had been exposed to her daughter who had upper respiratory tract symptoms and 2 grandchildren have rhinitis type symptoms  The cough has been associated with slight shortness of breath. Within the last week there has been some and sputum. She also has night sweats. She questions whether she had fever.  She has been using Mucinex DM and has noted some improvement in the cough over the last 2 days. The cough has been steroid to cause some scleral hemorrhage it she's also had some chest wall discomfort following the bouts of coughing.  She's had some associated sore throat  She has never smoked and has no past history of asthma    Review of Systems  She denies wheezing, significant frontal sinus pain, facial pain, dental pain, otic pain, or otic discharge.     Objective:   Physical Exam General appearance:good health ;well nourished; no acute distress or increased work of breathing is present.  No  lymphadenopathy about the head, neck, or axilla noted.   Eyes: No conjunctival inflammation or lid edema is present.   Ears:  External ear exam shows no significant lesions or deformities.  Otoscopic examination reveals wax on R. L TM normal.  Nose:  External nasal examination shows no deformity or inflammation. Nasal mucosa are pink and moist without lesions or exudates. L septal dislocation .No obstruction to airflow.   Oral exam: Dental hygiene is good; lips and gums are healthy appearing.There is no oropharyngeal erythema or exudate noted.   Neck:  No deformities, thyromegaly, masses, or tenderness noted.   Supple with full range of motion without pain.   Heart:  Normal rate and regular rhythm. S1 and S2 normal without gallop, murmur,  click, rub . S4.   Lungs:Chest clear to auscultation; no wheezes, rhonchi,rales ,or rubs present.No increased work of breathing.  Paroxysmal cough with inspiratory maneuvers  Extremities:  No cyanosis, edema, or clubbing  noted    Skin: Warm & dry          Assessment & Plan:  #1 acute bronchitis w/o bronchospasm  Plan: See orders and recommendations

## 2013-01-02 NOTE — Patient Instructions (Addendum)
Stay well hydrated. Drink to thirst up to 40 ounces of fluids daily. 

## 2013-02-11 ENCOUNTER — Encounter: Payer: Self-pay | Admitting: Lab

## 2013-02-12 ENCOUNTER — Ambulatory Visit: Payer: Medicare Other | Admitting: Internal Medicine

## 2013-02-13 ENCOUNTER — Encounter: Payer: Self-pay | Admitting: Family Medicine

## 2013-02-13 ENCOUNTER — Ambulatory Visit (INDEPENDENT_AMBULATORY_CARE_PROVIDER_SITE_OTHER): Payer: Medicare Other | Admitting: Family Medicine

## 2013-02-13 VITALS — BP 114/74 | HR 76 | Temp 97.8°F | Wt 127.6 lb

## 2013-02-13 DIAGNOSIS — D1779 Benign lipomatous neoplasm of other sites: Secondary | ICD-10-CM

## 2013-02-13 DIAGNOSIS — D171 Benign lipomatous neoplasm of skin and subcutaneous tissue of trunk: Secondary | ICD-10-CM

## 2013-02-13 DIAGNOSIS — M5432 Sciatica, left side: Secondary | ICD-10-CM

## 2013-02-13 DIAGNOSIS — M5430 Sciatica, unspecified side: Secondary | ICD-10-CM

## 2013-02-13 NOTE — Patient Instructions (Signed)

## 2013-02-15 ENCOUNTER — Encounter: Payer: Self-pay | Admitting: Family Medicine

## 2013-02-15 DIAGNOSIS — D171 Benign lipomatous neoplasm of skin and subcutaneous tissue of trunk: Secondary | ICD-10-CM | POA: Insufficient documentation

## 2013-02-15 DIAGNOSIS — M5430 Sciatica, unspecified side: Secondary | ICD-10-CM | POA: Insufficient documentation

## 2013-02-15 NOTE — Progress Notes (Signed)
  Subjective:    Patient ID: Dana Caldwell, female    DOB: 03-14-1946, 67 y.o.   MRN: [id number]  HPI Pt here c/o soft tumor on L side of back.  She just wants it looked at.  She also c/o leg pain.  Mass has been there very long time.  It is causing no pain. Pt also wanted to discuss her leg pain that has been bothering her for years but since she was here she wanted to mention it.  No known injury.  No foot drop or weakngess in legs.    Review of Systems    as above Objective:   Physical Exam BP 114/74  Pulse 76  Temp(Src) 97.8 F (36.6 C) (Oral)  Wt 127 lb 9.6 oz (57.879 kg)  BMI 20.61 kg/m2  SpO2 96% General appearance: alert, cooperative, appears stated age and no distress Back: symmetric, no curvature. ROM normal. No CVA tenderness., + fatty tumor L mid back Extremities: extremities normal, atraumatic, no cyanosis or edema Neurologic: Alert and oriented X 3, normal strength and tone. Normal symmetric reflexes. Normal coordination and gait      Assessment & Plan:

## 2013-02-15 NOTE — Assessment & Plan Note (Signed)
It is not bothering him Watch for now.  If any changes we will refer to surgery.

## 2013-02-15 NOTE — Assessment & Plan Note (Addendum)
Muscle relaxers Diclofenac Consider PT , xrays etc if no relief-- she will f/u pcp

## 2013-05-01 ENCOUNTER — Other Ambulatory Visit: Payer: Self-pay

## 2013-05-04 ENCOUNTER — Other Ambulatory Visit: Payer: Self-pay | Admitting: Internal Medicine

## 2013-05-05 NOTE — Telephone Encounter (Signed)
Bupropion refill sent to pharmacy 

## 2013-05-21 ENCOUNTER — Encounter: Payer: Self-pay | Admitting: Vascular Surgery

## 2013-05-26 ENCOUNTER — Encounter: Payer: Self-pay | Admitting: Vascular Surgery

## 2013-05-26 ENCOUNTER — Encounter: Payer: Self-pay | Admitting: *Deleted

## 2013-05-26 ENCOUNTER — Ambulatory Visit (INDEPENDENT_AMBULATORY_CARE_PROVIDER_SITE_OTHER): Payer: Medicare Other | Admitting: Vascular Surgery

## 2013-05-26 VITALS — BP 122/77 | HR 73 | Resp 16 | Ht 66.0 in | Wt 127.0 lb

## 2013-05-26 DIAGNOSIS — I83893 Varicose veins of bilateral lower extremities with other complications: Secondary | ICD-10-CM

## 2013-05-26 NOTE — Progress Notes (Signed)
   Laser Ablation Procedure      Date: 05/26/2013    Dana Caldwell DOB:19-Oct-1945  Consent signed: Yes  Surgeon:J.D. Hart [place]  Procedure:Stab Phlebs L leg  BP 122/77  Pulse 73  Resp 16  Ht 5\' 6"  (1.676 m)  Wt 127 lb (57.607 kg)  BMI 20.51 kg/m2  Start time: 11   End time: 11:50  Tumescent Anesthesia: 200 cc 0.9% NaCl with 50 cc Lidocaine HCL with 1% Epi and 15 cc 8.4% NaHCO3  Local Anesthesia: 10 cc Lidocaine HCL and NaHCO3 (ratio 2:1)       Stab Phlebectomy: 10-20 Sites: Thigh and Calf  Patient tolerated procedure well: Yes  Notes: Did have some pain during the proced that needed extra tumescent.  Description of Procedure:    The patient was  put into Trendelenburg position.  Local anesthetic was utilized overlying the marked varicosities.  Ten to 20 stab wounds were made using the tip of an 11 blade; and using the vein hook,  The phlebectomies were performed using a hemostat to avulse these varicosities.  Adequate hemostasis was achieved, and steri strips were applied to the stab wound.      ABD pads and thigh high compression stockings were applied.  Ace wrap bandages were applied over the phlebectomy sites.  Blood loss was less than 15 cc.  The patient ambulated out of the operating room having tolerated the procedure well.

## 2013-05-26 NOTE — Progress Notes (Signed)
Subjective:     Patient ID: Dana Caldwell, female   DOB: [birth year], 67 y.o.   MRN: [id number]  HPI this 67 year old female had multiple stab phlebectomy-approximately 18-left leg of painful varicosities performed under local tumescent anesthesia. She tolerated the procedure well   Review of Systems     Objective:   Physical Exam BP 122/77  Pulse 73  Resp 16  Ht 5\' 6"  (1.676 m)  Wt 127 lb (57.607 kg)  BMI 20.51 kg/m2        Assessment:     Well-tolerated multiple stab phlebectomy-10-20 left leg performed under local tumescent anesthesia    Plan:     To return on December 10 for foam sclerotherapy and then again to return in 2 months for final followup

## 2013-05-27 ENCOUNTER — Telehealth: Payer: Self-pay | Admitting: *Deleted

## 2013-05-27 NOTE — Telephone Encounter (Signed)
Patient doing well. No bleeding. Following all instructions. 

## 2013-06-03 ENCOUNTER — Encounter: Payer: Self-pay | Admitting: *Deleted

## 2013-06-04 ENCOUNTER — Ambulatory Visit (INDEPENDENT_AMBULATORY_CARE_PROVIDER_SITE_OTHER): Payer: Medicare Other | Admitting: *Deleted

## 2013-06-04 DIAGNOSIS — I781 Nevus, non-neoplastic: Secondary | ICD-10-CM | POA: Insufficient documentation

## 2013-06-04 NOTE — Progress Notes (Signed)
X=.3% Sotradecol administered with a 27g butterfly.  Patient received a total of 5cc.  Treated all her bothersome spiders (that hurt her) and a few reticulars. Easy access. Tol well. Follow prn.  Photos: no  Compression stockings applied: yes

## 2013-06-05 ENCOUNTER — Encounter: Payer: Self-pay | Admitting: Vascular Surgery

## 2013-06-05 ENCOUNTER — Encounter: Payer: Self-pay | Admitting: *Deleted

## 2013-06-13 ENCOUNTER — Other Ambulatory Visit: Payer: Self-pay | Admitting: Internal Medicine

## 2013-06-13 NOTE — Telephone Encounter (Signed)
Niacin refilled per protocol. JG//CMA 

## 2013-07-16 ENCOUNTER — Encounter: Payer: Self-pay | Admitting: Internal Medicine

## 2013-07-23 ENCOUNTER — Other Ambulatory Visit: Payer: Self-pay | Admitting: Internal Medicine

## 2013-07-23 NOTE — Telephone Encounter (Signed)
Vytorin refilled per protocol. JG//CMA 

## 2013-07-28 ENCOUNTER — Encounter: Payer: Self-pay | Admitting: Vascular Surgery

## 2013-07-29 ENCOUNTER — Ambulatory Visit (INDEPENDENT_AMBULATORY_CARE_PROVIDER_SITE_OTHER): Payer: Self-pay | Admitting: Vascular Surgery

## 2013-07-29 ENCOUNTER — Encounter: Payer: Self-pay | Admitting: Vascular Surgery

## 2013-07-29 VITALS — BP 132/59 | HR 81 | Resp 16 | Ht 66.0 in | Wt 130.0 lb

## 2013-07-29 DIAGNOSIS — I83893 Varicose veins of bilateral lower extremities with other complications: Secondary | ICD-10-CM

## 2013-07-29 NOTE — Progress Notes (Signed)
Subjective:     Patient ID: Dana Caldwell, female   DOB: 02-17-1946, 68 y.o.   MRN: [id number]  HPI this 68 year old female returns 3 months post multiple stab phlebectomy of painful varicosities left leg and sclerotherapy. She states that leg feels and looks much better. She denies any distal edema. There are 2 areas that had some mild discomfort in the posterior calf and posterior thigh which are minor she states. In general she is quite pleased.  Past Medical History  Diagnosis Date  . Hyperlipidemia     NMR 2007: LDL 222(3721/2730), HDL 52,TG 176). LDL goal +  < 115  . Migraine headache     Dr Domingo Cocking  . Allergic rhinitis   . Anxiety disorder     controlled with meds  . Anxiety   . Arthritis     thumbs    History  Substance Use Topics  . Smoking status: Never Smoker   . Smokeless tobacco: Never Used  . Alcohol Use: 0.0 oz/week     Comment:  rarely    Family History  Problem Relation Age of Onset  . Stroke Father     > 56  . Multiple sclerosis Mother   . Heart attack Brother      two brothers;52& 29  . Stroke Maternal Grandfather 33  . Breast cancer Paternal Grandmother   . Coronary artery disease Brother     CBAG @ 53  . Diabetes Paternal Uncle   . Colon cancer Neg Hx   . Esophageal cancer Neg Hx   . Stomach cancer Neg Hx   . Rectal cancer Neg Hx   . Heart attack Paternal Uncle     in 39s    Allergies  Allergen Reactions  . Penicillins     Hives while on PCN AND Sulfa  . Sulfonamide Derivatives     Hives while on PCN AND Sulfa    Current outpatient prescriptions:ALPRAZolam (XANAX) 0.25 MG tablet, Take 0.25 mg by mouth at bedtime as needed. Takes 0/5 tablet as needed, Disp: , Rfl: ;  aspirin 81 MG tablet, Take 81 mg by mouth daily.  , Disp: , Rfl: ;  baclofen (LIORESAL) 10 MG tablet, Take 10 mg by mouth as needed. , Disp: , Rfl: ;  buPROPion (WELLBUTRIN XL) 150 MG 24 hr tablet, take 1 tablet by mouth once daily, Disp: 90 tablet, Rfl: 0 cholecalciferol  (VITAMIN D) 1000 UNITS tablet, Take 1,000 Units by mouth daily.  , Disp: , Rfl: ;  Diclofenac Sodium (PENNSAID) 1.5 % SOLN, Place onto the skin as needed.  , Disp: , Rfl: ;  ergotamine-caffeine (CAFERGOT) 1-100 MG per tablet, Take 2 tablets by mouth once. Two tablets at onset of attack; then 1 tablet every 30 minutes as needed; maximum: 6 tablets per attack; do not exceed 10 tablets/week , Disp: , Rfl:  lidocaine (LIDODERM) 5 %, Place 1 patch onto the skin daily. Remove & Discard patch within 12 hours or as directed by MD , Disp: , Rfl: ;  Melatonin 5 MG TABS, Take 5 mg by mouth at bedtime.  , Disp: , Rfl: ;  metoCLOPramide (REGLAN) 10 MG tablet, Take 10 mg by mouth as needed., Disp: , Rfl: ;  mupirocin ointment (BACTROBAN) 2 %, Applied twice a day to the affected area;NOT into eyes., Disp: 15 g, Rfl: 0 naproxen (NAPROSYN) 250 MG tablet, Take 250 mg by mouth as needed. , Disp: , Rfl: ;  niacin (NIASPAN) 500 MG CR tablet, take 1  tablet by mouth at bedtime WITH APPLESAUCE, Disp: 90 tablet, Rfl: 1;  Omega-3 Fatty Acids (FISH OIL) 1000 MG CAPS, Take 1,000 mg by mouth 2 (two) times daily. , Disp: , Rfl: ;  raloxifene (EVISTA) 60 MG tablet, Take 60 mg by mouth daily.  , Disp: , Rfl:  rizatriptan (MAXALT) 5 MG tablet, Take 5 mg by mouth as needed. May repeat in 2 hours if needed , Disp: , Rfl: ;  tiZANidine (ZANAFLEX) 4 MG tablet, Take 4 mg by mouth every 6 (six) hours as needed., Disp: , Rfl: ;  VYTORIN 10-40 MG per tablet, take 1 tablet by mouth once daily, Disp: 90 tablet, Rfl: 1;  azithromycin (ZITHROMAX Z-PAK) 250 MG tablet, 2 day 1, then 1 qd, Disp: 6 each, Rfl: 0 HYDROcodone-homatropine (HYDROMET) 5-1.5 MG/5ML syrup, Take 5 mLs by mouth every 6 (six) hours as needed for cough., Disp: 120 mL, Rfl: 0  BP 132/59  Pulse 81  Resp 16  Ht 5\' 6"  (1.676 m)  Wt 130 lb (58.968 kg)  BMI 20.99 kg/m2  Body mass index is 20.99 kg/(m^2).           Review of Systems denies chest pain, dyspnea on exertion,  PND, orthopnea, hemoptysis.    Objective:   Physical Exam BP 132/59  Pulse 81  Resp 16  Ht 5\' 6"  (1.676 m)  Wt 130 lb (58.968 kg)  BMI 20.99 kg/m2  General well-developed well-nourished female no apparent distress alert and oriented x3 Lungs no rhonchi or wheezing Left leg-nicely healed stab phlebectomy sites in calf and distal thigh. 2 posterior cells pedis pulse palpable. Left foot well-perfused. One small blush of spider veins in the proximal medial calf area.      Assessment:     Successful well-tolerated stab phlebectomy and sclerotherapy left lower extremity of secondary varicosities    Plan:     Return to see [place] on a when necessary basis

## 2013-09-03 ENCOUNTER — Ambulatory Visit (INDEPENDENT_AMBULATORY_CARE_PROVIDER_SITE_OTHER): Payer: Medicare Other | Admitting: Internal Medicine

## 2013-09-03 ENCOUNTER — Encounter: Payer: Self-pay | Admitting: Internal Medicine

## 2013-09-03 VITALS — BP 100/50 | HR 74 | Temp 97.6°F | Ht 65.25 in | Wt 130.6 lb

## 2013-09-03 DIAGNOSIS — M949 Disorder of cartilage, unspecified: Secondary | ICD-10-CM

## 2013-09-03 DIAGNOSIS — M899 Disorder of bone, unspecified: Secondary | ICD-10-CM

## 2013-09-03 DIAGNOSIS — Z0000 Encounter for general adult medical examination without abnormal findings: Secondary | ICD-10-CM

## 2013-09-03 DIAGNOSIS — E559 Vitamin D deficiency, unspecified: Secondary | ICD-10-CM

## 2013-09-03 DIAGNOSIS — K635 Polyp of colon: Secondary | ICD-10-CM

## 2013-09-03 DIAGNOSIS — E782 Mixed hyperlipidemia: Secondary | ICD-10-CM

## 2013-09-03 DIAGNOSIS — D126 Benign neoplasm of colon, unspecified: Secondary | ICD-10-CM

## 2013-09-03 DIAGNOSIS — M8580 Other specified disorders of bone density and structure, unspecified site: Secondary | ICD-10-CM

## 2013-09-03 MED ORDER — ALPRAZOLAM 0.25 MG PO TABS: ORAL_TABLET | ORAL | Status: AC

## 2013-09-03 NOTE — Progress Notes (Signed)
Subjective:    Patient ID: Dana Caldwell, female    DOB: 12-18-1945, 68 y.o.   MRN: [id number]  HPI Medicare Wellness Visit: Psychosocial and medical history were reviewed as required by Medicare (history related to abuse, antisocial behavior , firearm risk). Social history: Caffeine: 1 cup/day  , Alcohol: rarely , Tobacco BSJ:GGEZM Exercise:see below Personal safety/fall risk: no Limitations of activities of daily living:no Seatbelt/ smoke alarm use:yes Healthcare Power of Attorney/Living Will status: UTD Ophthalmologic exam status:UTD Hearing evaluation status:not current Orientation: Oriented X 3 Memory and recall: good Spelling  testing: good Depression/anxiety assessment: no Foreign travel history:[place] 2015 Immunization status for influenza/pneumonia/ shingles /tetanus:UTD Transfusion history:no Preventive health care maintenance status: Colonoscopy/BMD/mammogram/Pap as per protocol/standard care:UTD Dental care:every 6 mos Chart reviewed and updated. Active issues reviewed and addressed as documented below.    Review of Systems A heart healthy diet is followed; exercise encompasses 60-90 minutes 5  times per week without symptoms.  Family history is significant for premature coronary disease. Advanced cholesterol testing reveals  LDL goal is less than 100 ; ideally < 70 . There is medication compliance with the statin.  Low dose ASA taken Specifically denied are  chest pain, palpitations, dyspnea, or claudication.  Significant abdominal symptoms, memory deficit, or myalgias not present. Some arthralgias & intermittent sciatica LLE     Objective:   Physical Exam Gen.: Healthy and well-nourished in appearance. Alert, appropriate and cooperative throughout exam. Appears younger than stated age  Head: Normocephalic without obvious abnormalities Eyes: No corneal or conjunctival inflammation noted. Pupils equal round reactive to light and accommodation. Extraocular  motion intact. Ears: External  ear exam reveals no significant lesions or deformities. Wax on R. L TM normal. Hearing is grossly normal bilaterally. Nose: External nasal exam reveals no deformity or inflammation. Nasal mucosa are pink and moist. No lesions or exudates noted.  Minor L septal dislocation. Mouth: Oral mucosa and oropharynx reveal no lesions or exudates. Teeth in good repair. Neck: No deformities, masses, or tenderness noted. Range of motion decreased. Thyroid normal. Lungs: Normal respiratory effort; chest expands symmetrically. Lungs are clear to auscultation without rales, wheezes, or increased work of breathing. Heart: Normal rate and rhythm. Normal S1 and S2. No gallop, click, or rub. No murmur. Abdomen: Bowel sounds normal; abdomen soft and nontender. No masses, organomegaly or hernias noted. Genitalia:  as per Gyn                                  Musculoskeletal/extremities: No deformity or scoliosis noted of  the thoracic or lumbar spine.   No clubbing, cyanosis, edema, or significant extremity  deformity noted. Range of motion normal .Tone & strength normal. Hand joints normal Fingernail  health good. Able to lie down & sit up w/o help. Negative SLR bilaterally Vascular: Carotid, radial artery, dorsalis pedis and  posterior tibial pulses are full and equal. No bruits present. Neurologic: Alert and oriented x3. Deep tendon reflexes symmetrical and normal.  Gait normal       Skin: Intact without suspicious lesions or rashes. Lymph: No cervical, axillary lymphadenopathy present. Psych: Mood and affect are normal. Normally interactive  Assessment & Plan:  #1 Medicare Wellness Exam; criteria met ; data entered #2 Problem List/Diagnoses reviewed Plan:  Assessments made/ Orders entered  

## 2013-09-03 NOTE — Progress Notes (Signed)
Pre visit review using our clinic review tool, if applicable. No additional management support is needed unless otherwise documented below in the visit note. 

## 2013-09-03 NOTE — Patient Instructions (Signed)
Your next office appointment will be determined based upon review of your pending labs. Those instructions will be transmitted to you through My Chart . 

## 2013-09-07 ENCOUNTER — Other Ambulatory Visit: Payer: Self-pay | Admitting: Internal Medicine

## 2013-09-17 ENCOUNTER — Other Ambulatory Visit (INDEPENDENT_AMBULATORY_CARE_PROVIDER_SITE_OTHER): Payer: Medicare Other

## 2013-09-17 DIAGNOSIS — K635 Polyp of colon: Secondary | ICD-10-CM

## 2013-09-17 DIAGNOSIS — M8580 Other specified disorders of bone density and structure, unspecified site: Secondary | ICD-10-CM

## 2013-09-17 DIAGNOSIS — D126 Benign neoplasm of colon, unspecified: Secondary | ICD-10-CM

## 2013-09-17 DIAGNOSIS — E559 Vitamin D deficiency, unspecified: Secondary | ICD-10-CM

## 2013-09-17 DIAGNOSIS — E782 Mixed hyperlipidemia: Secondary | ICD-10-CM

## 2013-09-17 LAB — CBC WITH DIFFERENTIAL/PLATELET
BASOS PCT: 0.8 % (ref 0.0–3.0)
Basophils Absolute: 0 10*3/uL (ref 0.0–0.1)
Eosinophils Absolute: 0.2 10*3/uL (ref 0.0–0.7)
Eosinophils Relative: 5.1 % — ABNORMAL HIGH (ref 0.0–5.0)
HEMATOCRIT: 44.8 % (ref 36.0–46.0)
Hemoglobin: 15.1 g/dL — ABNORMAL HIGH (ref 12.0–15.0)
LYMPHS ABS: 1.9 10*3/uL (ref 0.7–4.0)
Lymphocytes Relative: 40 % (ref 12.0–46.0)
MCHC: 33.8 g/dL (ref 30.0–36.0)
MCV: 96.4 fl (ref 78.0–100.0)
MONO ABS: 0.3 10*3/uL (ref 0.1–1.0)
Monocytes Relative: 7.2 % (ref 3.0–12.0)
Neutro Abs: 2.2 10*3/uL (ref 1.4–7.7)
Neutrophils Relative %: 46.9 % (ref 43.0–77.0)
PLATELETS: 202 10*3/uL (ref 150.0–400.0)
RBC: 4.64 Mil/uL (ref 3.87–5.11)
RDW: 13.8 % (ref 11.5–14.6)
WBC: 4.8 10*3/uL (ref 4.5–10.5)

## 2013-09-17 LAB — BASIC METABOLIC PANEL
BUN: 22 mg/dL (ref 6–23)
CHLORIDE: 106 meq/L (ref 96–112)
CO2: 31 meq/L (ref 19–32)
CREATININE: 0.8 mg/dL (ref 0.4–1.2)
Calcium: 9.3 mg/dL (ref 8.4–10.5)
GFR: 75.74 mL/min (ref >60.00)
Glucose, Bld: 100 mg/dL — ABNORMAL HIGH (ref 70–99)
Potassium: 4.3 mEq/L (ref 3.5–5.1)
Sodium: 141 mEq/L (ref 135–145)

## 2013-09-17 LAB — HEPATIC FUNCTION PANEL
ALT: 22 U/L (ref 0–35)
AST: 26 U/L (ref 0–37)
Albumin: 4.2 g/dL (ref 3.5–5.2)
Alkaline Phosphatase: 59 U/L (ref 39–117)
Bilirubin, Direct: 0.1 mg/dL (ref 0.0–0.3)
TOTAL PROTEIN: 6.8 g/dL (ref 6.0–8.3)
Total Bilirubin: 0.7 mg/dL (ref 0.3–1.2)

## 2013-09-17 LAB — LIPID PANEL
CHOL/HDL RATIO: 2
CHOLESTEROL: 181 mg/dL (ref 0–200)
HDL: 88.6 mg/dL (ref >39.00)
LDL Cholesterol: 82 mg/dL (ref 0–99)
TRIGLYCERIDES: 51 mg/dL (ref 0.0–149.0)
VLDL: 10.2 mg/dL (ref 0.0–40.0)

## 2013-09-17 LAB — TSH: TSH: 2.34 u[IU]/mL (ref 0.35–5.50)

## 2013-09-24 LAB — VITAMIN D 1,25 DIHYDROXY
Vitamin D 1, 25 (OH)2 Total: 54 pg/mL (ref 18–72)
Vitamin D2 1, 25 (OH)2: <8 pg/mL
Vitamin D3 1, 25 (OH)2: 54 pg/mL

## 2013-12-22 ENCOUNTER — Other Ambulatory Visit: Payer: Self-pay

## 2013-12-22 MED ORDER — BUPROPION HCL ER (XL) 150 MG PO TB24: ORAL_TABLET | ORAL | Status: DC

## 2013-12-22 NOTE — Telephone Encounter (Signed)
OK X1 

## 2014-02-12 ENCOUNTER — Other Ambulatory Visit: Payer: Self-pay

## 2014-02-12 MED ORDER — EZETIMIBE-SIMVASTATIN 10-40 MG PO TABS: ORAL_TABLET | ORAL | Status: DC

## 2014-03-20 ENCOUNTER — Other Ambulatory Visit: Payer: Self-pay

## 2014-03-20 MED ORDER — BUPROPION HCL ER (XL) 150 MG PO TB24: ORAL_TABLET | ORAL | Status: DC

## 2014-04-10 ENCOUNTER — Other Ambulatory Visit: Payer: Self-pay

## 2014-07-16 ENCOUNTER — Telehealth: Payer: Self-pay | Admitting: Internal Medicine

## 2014-07-16 MED ORDER — BUPROPION HCL ER (XL) 150 MG PO TB24: ORAL_TABLET | ORAL | Status: DC

## 2014-07-16 NOTE — Telephone Encounter (Signed)
Patient requesting a refill of buPROPion (WELLBUTRIN XL) 150 MG 24 hr tablet [[id number]]

## 2014-07-16 NOTE — Telephone Encounter (Signed)
OK X 3 mos but needs F/U Last OV 3/15

## 2014-07-16 NOTE — Telephone Encounter (Signed)
Done

## 2014-08-04 ENCOUNTER — Encounter: Payer: Self-pay | Admitting: Internal Medicine

## 2014-08-17 ENCOUNTER — Other Ambulatory Visit: Payer: Self-pay

## 2014-08-17 MED ORDER — EZETIMIBE-SIMVASTATIN 10-40 MG PO TABS: ORAL_TABLET | ORAL | Status: AC

## 2014-09-22 ENCOUNTER — Telehealth: Payer: Self-pay | Admitting: *Deleted

## 2014-09-22 NOTE — Telephone Encounter (Signed)
Medical record request received via fax from [facility]. Forwarded to [place] to scan/email to medical records. JG//CMA

## 2014-12-09 ENCOUNTER — Encounter: Payer: Self-pay | Admitting: Internal Medicine

## 2014-12-21 ENCOUNTER — Other Ambulatory Visit: Payer: Self-pay

## 2015-01-28 LAB — HM MAMMOGRAPHY

## 2015-02-03 ENCOUNTER — Encounter: Payer: Self-pay | Admitting: Internal Medicine

## 2015-03-08 ENCOUNTER — Telehealth: Payer: Self-pay | Admitting: Emergency Medicine

## 2015-03-08 NOTE — Telephone Encounter (Signed)
Called and spoke to pt's husband. Informed him that pt needs an OV and labs before refills. Last seen 3/15. Pt will call back to make appt when available.

## 2016-05-16 ENCOUNTER — Ambulatory Visit (HOSPITAL_COMMUNITY)
Admission: RE | Admit: 2016-05-16 | Discharge: 2016-05-16 | Disposition: A | Discharge status: Home / Self Care | Payer: Medicare Other | Source: Ambulatory Visit | Attending: Vascular Surgery | Admitting: Vascular Surgery

## 2016-05-16 ENCOUNTER — Other Ambulatory Visit (HOSPITAL_COMMUNITY): Payer: Self-pay | Admitting: Internal Medicine

## 2016-05-16 DIAGNOSIS — I739 Peripheral vascular disease, unspecified: Secondary | ICD-10-CM

## 2016-09-21 ENCOUNTER — Encounter: Payer: Self-pay | Admitting: Gastroenterology

## 2016-10-09 ENCOUNTER — Encounter: Payer: Self-pay | Admitting: Gastroenterology

## 2016-12-13 ENCOUNTER — Encounter: Payer: Medicare Other | Admitting: Gastroenterology

## 2016-12-14 ENCOUNTER — Ambulatory Visit (AMBULATORY_SURGERY_CENTER): Payer: Self-pay

## 2016-12-14 VITALS — Ht 65.5 in | Wt 129.0 lb

## 2016-12-14 DIAGNOSIS — Z86010 Personal history of colonic polyps: Secondary | ICD-10-CM

## 2016-12-14 MED ORDER — NA SULFATE-K SULFATE-MG SULF 17.5-3.13-1.6 GM/177ML PO SOLN: ORAL | 0 refills | Status: DC

## 2016-12-14 NOTE — Progress Notes (Signed)
Per pt, no allergies to soy or egg products.Pt not taking any weight loss meds or using  O2 at home.  Emmi video sent to pt's email. 

## 2016-12-21 ENCOUNTER — Encounter: Payer: Self-pay | Admitting: Gastroenterology

## 2017-01-04 ENCOUNTER — Encounter: Payer: Self-pay | Admitting: Gastroenterology

## 2017-01-04 ENCOUNTER — Ambulatory Visit (AMBULATORY_SURGERY_CENTER): Payer: Medicare Other | Admitting: Gastroenterology

## 2017-01-04 VITALS — BP 121/65 | HR 64 | Temp 98.7°F | Resp 13 | Ht 65.5 in | Wt 129.0 lb

## 2017-01-04 DIAGNOSIS — Z86010 Personal history of colonic polyps: Secondary | ICD-10-CM | POA: Diagnosis not present

## 2017-01-04 DIAGNOSIS — D123 Benign neoplasm of transverse colon: Secondary | ICD-10-CM | POA: Diagnosis not present

## 2017-01-04 MED ORDER — SODIUM CHLORIDE 0.9 % IV SOLN: 500.0000 mL | INTRAVENOUS | Status: AC

## 2017-01-04 NOTE — Op Note (Signed)
[place] Patient Name: Dana Caldwell Procedure Date: 01/04/2017 9:01 AM MRN: [id number] Endoscopist: Remo Lipps P. Armbruster MD, MD Age: 71 Referring MD:  Date of Birth: 02-Apr-1946 Gender: Female Account #: [id number] Procedure:                Colonoscopy Indications:              Surveillance: Personal history of adenomatous                            polyps on last colonoscopy 5 years ago Medicines:                Monitored Anesthesia Care Procedure:                Pre-Anesthesia Assessment:                           - Prior to the procedure, a History and Physical                            was performed, and patient medications and                            allergies were reviewed. The patient's tolerance of                            previous anesthesia was also reviewed. The risks                            and benefits of the procedure and the sedation                            options and risks were discussed with the patient.                            All questions were answered, and informed consent                            was obtained. Prior Anticoagulants: The patient has                            taken aspirin, last dose was 1 day prior to                            procedure. ASA Grade Assessment: II - A patient                            with mild systemic disease. After reviewing the                            risks and benefits, the patient was deemed in                            satisfactory condition to undergo the procedure.  After obtaining informed consent, the colonoscope                            was passed under direct vision. Throughout the                            procedure, the patient's blood pressure, pulse, and                            oxygen saturations were monitored continuously. The                            Colonoscope was introduced through the anus and                            advanced to the the  cecum, identified by                            appendiceal orifice and ileocecal valve. The                            colonoscopy was performed without difficulty. The                            patient tolerated the procedure well. The quality                            of the bowel preparation was good. The ileocecal                            valve, appendiceal orifice, and rectum were                            photographed. Scope In: 9:04:29 AM Scope Out: 9:19:28 AM Scope Withdrawal Time: 0 hours 9 minutes 52 seconds  Total Procedure Duration: 0 hours 14 minutes 59 seconds  Findings:                 The perianal and digital rectal examinations were                            normal.                           A 3 mm polyp was found in the hepatic flexure. The                            polyp was sessile. The polyp was removed with a                            cold snare. Resection and retrieval were complete.                           Scattered medium-mouthed diverticula were found in  the left colon.                           Internal hemorrhoids were found during retroflexion.                           The exam was otherwise without abnormality. Complications:            No immediate complications. Estimated blood loss:                            Minimal. Estimated Blood Loss:     Estimated blood loss was minimal. Impression:               - One 3 mm polyp at the hepatic flexure, removed                            with a cold snare. Resected and retrieved.                           - Diverticulosis in the left colon.                           - Internal hemorrhoids.                           - The examination was otherwise normal. Recommendation:           - Patient has a contact number available for                            emergencies. The signs and symptoms of potential                            delayed complications were discussed with the                             patient. Return to normal activities tomorrow.                            Written discharge instructions were provided to the                            patient.                           - Resume previous diet.                           - Continue present medications.                           - Await pathology results.                           - Repeat colonoscopy is recommended for  surveillance. The colonoscopy date will be                            determined after pathology results from today's                            exam become available for review.                           - No ibuprofen, naproxen, or other non-steroidal                            anti-inflammatory drugs for 2 weeks after polyp                            removal. Remo Lipps P. Armbruster MD, MD 01/04/2017 9:23:15 AM This report has been signed electronically.

## 2017-01-04 NOTE — Patient Instructions (Signed)
YOU HAD AN ENDOSCOPIC PROCEDURE TODAY AT [facility]:   Refer to the procedure report that was given to you for any specific questions about what was found during the examination.  If the procedure report does not answer your questions, please call your gastroenterologist to clarify.  If you requested that your care partner not be given the details of your procedure findings, then the procedure report has been included in a sealed envelope for you to review at your convenience later.  YOU SHOULD EXPECT: Some feelings of bloating in the abdomen. Passage of more gas than usual.  Walking can help get rid of the air that was put into your GI tract during the procedure and reduce the bloating. If you had a lower endoscopy (such as a colonoscopy or flexible sigmoidoscopy) you may notice spotting of blood in your stool or on the toilet paper. If you underwent a bowel prep for your procedure, you may not have a normal bowel movement for a few days.  Please Note:  You might notice some irritation and congestion in your nose or some drainage.  This is from the oxygen used during your procedure.  There is no need for concern and it should clear up in a day or so.  SYMPTOMS TO REPORT IMMEDIATELY:   Following lower endoscopy (colonoscopy or flexible sigmoidoscopy):  Excessive amounts of blood in the stool  Significant tenderness or worsening of abdominal pains  Swelling of the abdomen that is new, acute  Fever of 100F or higher    For urgent or emergent issues, a gastroenterologist can be reached at any hour by calling [phone].  Please read all handouts given to you by your recovery nurse. No NSAIDS, ibuprofen, motrin or Aspirin products for 2 weeks.  DIET:  We do recommend a small meal at first, but then you may proceed to your regular diet.  Drink plenty of fluids but you should avoid alcoholic beverages for 24 hours.  ACTIVITY:  You should plan to take it easy for the rest of  today and you should NOT DRIVE or use heavy machinery until tomorrow (because of the sedation medicines used during the test).    FOLLOW UP: Our staff will call the number listed on your records the next business day following your procedure to check on you and address any questions or concerns that you may have regarding the information given to you following your procedure. If we do not reach you, we will leave a message.  However, if you are feeling well and you are not experiencing any problems, there is no need to return our call.  We will assume that you have returned to your regular daily activities without incident.  If any biopsies were taken you will be contacted by phone or by letter within the next 1-3 weeks.  Please call us at [phone] if you have not heard about the biopsies in 3 weeks.    SIGNATURES/CONFIDENTIALITY: You and/or your care partner have signed paperwork which will be entered into your electronic medical record.  These signatures attest to the fact that that the information above on your After Visit Summary has been reviewed and is understood.  Full responsibility of the confidentiality of this discharge information lies with you and/or your care-partner.  Thank you for letting us take care of your healthcare needs today.

## 2017-01-04 NOTE — Progress Notes (Signed)
Report to PACU, RN, vss, BBS= Clear.  

## 2017-01-04 NOTE — Progress Notes (Signed)
Called to room to assist during endoscopic procedure.  Patient ID and intended procedure confirmed with present staff. Received instructions for my participation in the procedure from the performing physician.  

## 2017-01-05 ENCOUNTER — Telehealth: Payer: Self-pay | Admitting: *Deleted

## 2017-01-05 NOTE — Telephone Encounter (Signed)
No answer. Number identifier. Message left to call if questions or concerns and we would make an additional attempt to call later in the day.

## 2017-01-05 NOTE — Telephone Encounter (Signed)
No answer. Number identifier. Message left to call if questions or concerns. 

## 2017-01-12 ENCOUNTER — Encounter: Payer: Self-pay | Admitting: Gastroenterology

## 2017-08-06 ENCOUNTER — Other Ambulatory Visit: Payer: Self-pay

## 2017-08-06 DIAGNOSIS — I83812 Varicose veins of left lower extremities with pain: Secondary | ICD-10-CM

## 2017-08-09 ENCOUNTER — Ambulatory Visit (HOSPITAL_COMMUNITY)
Admission: RE | Admit: 2017-08-09 | Discharge: 2017-08-09 | Disposition: A | Discharge status: Home / Self Care | Payer: Medicare Other | Source: Ambulatory Visit | Attending: Vascular Surgery | Admitting: Vascular Surgery

## 2017-08-09 DIAGNOSIS — I83812 Varicose veins of left lower extremities with pain: Secondary | ICD-10-CM | POA: Diagnosis not present

## 2017-08-09 DIAGNOSIS — I872 Venous insufficiency (chronic) (peripheral): Secondary | ICD-10-CM | POA: Diagnosis not present

## 2017-08-14 ENCOUNTER — Ambulatory Visit: Payer: Medicare Other | Admitting: Vascular Surgery

## 2017-08-14 ENCOUNTER — Encounter: Payer: Self-pay | Admitting: Vascular Surgery

## 2017-08-14 ENCOUNTER — Other Ambulatory Visit: Payer: Self-pay

## 2017-08-14 VITALS — BP 127/73 | HR 75 | Temp 97.4°F | Resp 14 | Ht 65.5 in | Wt 128.0 lb

## 2017-08-14 DIAGNOSIS — I83892 Varicose veins of left lower extremities with other complications: Secondary | ICD-10-CM | POA: Diagnosis not present

## 2017-08-14 NOTE — Progress Notes (Signed)
Subjective:     Patient ID: Dana Caldwell, female   DOB: 1946-01-24, 72 y.o.   MRN: [id number]  HPI This 72 year old female is known to me having undergone multiple stab phlebectomy of painful varicosities in the left leg in 2014. She had some reflux in the left great saphenous system but it was not enlarged laser was not indicated. Over the past year or so she has developed some more varicosities in the left medial calf area. She denies any DVT, phlebitis stasis ulcers or bleeding. He does not develop swelling and does not were elastic compression stockings on a regular basis area she also has had some aching discomfort in her left buttock area which is unexplained.  Past Medical History:  Diagnosis Date  . Allergic rhinitis   . Anxiety   . Anxiety disorder    controlled with meds  . Arthritis    thumbs  . Fibrocystic breast disease    right breast  . Hyperlipidemia    NMR 2007: LDL 222(3721/2730), HDL 52,TG 176). LDL goal +  < 115  . Migraine headache    Dr Domingo Cocking  . Sleep apnea    no c-pap/uses mouth piece    Social History   Tobacco Use  . Smoking status: Never Smoker  . Smokeless tobacco: Never Used  Substance Use Topics  . Alcohol use: Yes    Alcohol/week: 0.0 oz    Comment: rarely    Family History  Problem Relation Age of Onset  . Stroke Father        > 71  . Multiple sclerosis Mother   . Heart attack Brother         two brothers;52& 68  . Stroke Maternal Grandfather 84  . Breast cancer Paternal Grandmother   . Coronary artery disease Brother        CBAG @ 5  . Diabetes Brother   . Stroke Brother   . Diabetes Paternal Uncle   . Heart attack Paternal Uncle        in 13s  . Renal cancer Brother        cns mets  . Colon cancer Neg Hx   . Esophageal cancer Neg Hx   . Stomach cancer Neg Hx   . Rectal cancer Neg Hx     Allergies  Allergen Reactions  . Penicillins     Hives while on PCN AND Sulfa  . Sulfonamide Derivatives     Hives while on PCN AND  Sulfa     Current Outpatient Medications:  .  ALPRAZolam (XANAX) 0.25 MG tablet, Takes 0/5 tablet as needed, Disp: 30 tablet, Rfl: 0 .  aspirin 81 MG tablet, Take 81 mg by mouth daily.  , Disp: , Rfl:  .  baclofen (LIORESAL) 10 MG tablet, Take 10 mg by mouth as needed for muscle spasms., Disp: , Rfl:  .  buPROPion (WELLBUTRIN XL) 150 MG 24 hr tablet, take 1 tablet by mouth once daily (Patient taking differently: 150 mg. take 1/2 tablet by mouth once daily), Disp: 90 tablet, Rfl: 0 .  cholecalciferol (VITAMIN D) 1000 UNITS tablet, Take 1,000 Units by mouth daily.  , Disp: , Rfl:  .  diclofenac (VOLTAREN) 50 MG EC tablet, Take 1 tablet by mouth as needed., Disp: , Rfl:  .  Diclofenac Sodium (PENNSAID) 1.5 % SOLN, Place onto the skin as needed.  , Disp: , Rfl:  .  ezetimibe-simvastatin (VYTORIN) 10-40 MG per tablet, take 1 tablet by mouth once daily,  Disp: 90 tablet, Rfl: 1 .  fluticasone (FLONASE) 50 MCG/ACT nasal spray, Place 2 sprays into both nostrils as needed. , Disp: , Rfl:  .  Melatonin 5 MG TABS, Take 5 mg by mouth at bedtime as needed. , Disp: , Rfl:  .  naproxen (NAPROSYN) 250 MG tablet, Take 250 mg by mouth as needed. , Disp: , Rfl:  .  Omega-3 Fatty Acids (FISH OIL) 1000 MG CAPS, Take 1,200 mg by mouth daily. , Disp: , Rfl:  .  OnabotulinumtoxinA (BOTOX IJ), Inject as directed. Botox injection every 3 months., Disp: , Rfl:  .  TURMERIC PO, Take by mouth 2 (two) times daily., Disp: , Rfl:  .  zolpidem (AMBIEN) 5 MG tablet, Take 5 mg by mouth at bedtime as needed for sleep., Disp: , Rfl:  .  ergotamine-caffeine (CAFERGOT) 1-100 MG per tablet, Take 2 tablets by mouth once. Two tablets at onset of attack; then 1 tablet every 30 minutes as needed; maximum: 6 tablets per attack; do not exceed 10 tablets/week , Disp: , Rfl:  .  lidocaine (LIDODERM) 5 %, Place 1 patch onto the skin daily. Remove & Discard patch within 12 hours or as directed by MD , Disp: , Rfl:  .  metoCLOPramide (REGLAN) 10  MG tablet, Take 10 mg by mouth as needed., Disp: , Rfl:  .  niacin (NIASPAN) 500 MG CR tablet, take 1 tablet by mouth at bedtime WITH APPLESAUCE (Patient not taking: Reported on 12/14/2016), Disp: 90 tablet, Rfl: 1 .  raloxifene (EVISTA) 60 MG tablet, Take 60 mg by mouth daily.  , Disp: , Rfl:  .  tiZANidine (ZANAFLEX) 4 MG tablet, Take 4 mg by mouth every 6 (six) hours as needed., Disp: , Rfl:   Current Facility-Administered Medications:  .  0.9 %  sodium chloride infusion, 500 mL, Intravenous, Continuous, Armbruster, Carlota Raspberry, MD  Vitals:   08/14/17 0926  BP: 127/73  Pulse: 75  Resp: 14  Temp: (!) 97.4 F (36.3 C)  TempSrc: Oral  SpO2: 97%  Weight: 128 lb (58.1 kg)  Height: 5' 5.5" (1.664 m)    Body mass index is 20.98 kg/m.         Review of Systems Denies chest pain, dyspnea on exertion, PND, orthopnea, hemoptysis, claudication    Objective:   Physical Exam BP 127/73 (BP Location: Left Arm, Patient Position: Sitting, Cuff Size: Normal)   Pulse 75   Temp (!) 97.4 F (36.3 C) (Oral)   Resp 14   Ht 5' 5.5" (1.664 m)   Wt 128 lb (58.1 kg)   SpO2 97%   BMI 20.98 kg/m     Gen.-alert and oriented x3 in no apparent distress HEENT normal for age Lungs no rhonchi or wheezing Cardiovascular regular rhythm no murmurs carotid pulses 3+ palpable no bruits audible Abdomen soft nontender no palpable masses Musculoskeletal free of  major deformities Skin clear -no rashes Neurologic normal Lower extremities 3+ femoral and dorsalis pedis pulses palpable bilaterally with no edema Left leg with small varicosities beginning in the medial calf below the knee extending anteriorly and posteriorly. No hyperpigmentation or ulceration noted distally. No significant spider veins noted.  I reviewed the recent ultrasound study done on 08/10/2017 and also performed a bedside SonoSite ultrasound exam. Left great saphenous vein is not a large caliber vein . There are areas where reflux is  noted.       Assessment:     Small varicosities left calf with no significant enlargement  left great saphenous system with some areas of gross reflux    Plan:     Treatment would consist of either stab phlebectomy-10-20 or foam sclerotherapy Discuss both these options with patient and she will consider this

## 2017-08-21 ENCOUNTER — Ambulatory Visit (INDEPENDENT_AMBULATORY_CARE_PROVIDER_SITE_OTHER): Payer: Medicare Other | Admitting: Vascular Surgery

## 2017-08-21 ENCOUNTER — Encounter: Payer: Self-pay | Admitting: Vascular Surgery

## 2017-08-21 VITALS — BP 122/78 | HR 73 | Temp 97.6°F | Resp 18 | Ht 65.5 in | Wt 129.4 lb

## 2017-08-21 DIAGNOSIS — I868 Varicose veins of other specified sites: Secondary | ICD-10-CM

## 2017-08-21 DIAGNOSIS — I83892 Varicose veins of left lower extremities with other complications: Secondary | ICD-10-CM

## 2017-08-21 HISTORY — PX: OTHER SURGICAL HISTORY: SHX169

## 2017-08-21 NOTE — Progress Notes (Signed)
Subjective:     Patient ID: Dana Caldwell, female   DOB: [birth year], 72 y.o.   MRN: [id number]  HPI This 72 year old female had multiple stab phlebectomy of painful varicosities in the left leg thigh and calf performed under local tumescent anesthesia. A total of 10-20 phlebectomy's were performed. She tolerated procedure well.  Review of Systems     Objective:   Physical Exam BP 122/78 (BP Location: Left Arm, Patient Position: Sitting, Cuff Size: Normal)   Pulse 73   Temp 97.6 F (36.4 C) (Oral)   Resp 18   Ht 5' 5.5" (1.664 m)   Wt 129 lb 6.4 oz (58.7 kg)   SpO2 100%   BMI 21.21 kg/m        Assessment:     Well-tolerated multiple stab phlebectomy of painful varicosities performed under local tumescent anesthesia-10-20    Plan:     Patient will return to see me on a when necessary basis if she has any concerns or problems

## 2017-08-21 NOTE — Progress Notes (Signed)
    Stab Phlebectomy Procedure  Kinlee Garrison DOB:07-11-1945  08/21/2017  Consent signed: Yes  Surgeon:J.D. Kellie Simmering, MD  Procedure: stab phlebectomy: left leg  BP 122/78 (BP Location: Left Arm, Patient Position: Sitting, Cuff Size: Normal)   Pulse 73   Temp 97.6 F (36.4 C) (Oral)   Resp 18   Ht 5' 5.5" (1.664 m)   Wt 129 lb 6.4 oz (58.7 kg)   SpO2 100%   BMI 21.21 kg/m   Start time: 9:00AM   End time: 9:30AM   Tumescent Anesthesia: 225 cc 0.9% NaCl with 50 cc Lidocaine HCL  and 15 cc 8.4% NaHCO3  Local Anesthesia: 8 cc Lidocaine HCL and NaHCO3 (ratio 2:1)    Stab Phlebectomy: 10-20 Sites: Thigh and Calf  Patient tolerated procedure well: Yes    Description of Procedure:  After marking the course of the secondary varicosities, the patient was placed on the operating table in the supine position, and the left leg was prepped and draped in sterile fashion.    The patient was then put into Trendelenburg position.  Local anesthetic was administered at the previously marked varicosities, and tumescent anesthesia was administered around the vessels.  Ten to 20 stab wounds were made using the tip of an 11 blade. And using the vein hook, the phlebectomies were performed using a hemostat to avulse the varicosities.  Adequate hemostasis was achieved, and steri strips were applied to the stab wound.      ABD pads and thigh high compression stockings were applied as well ace wraps where needed. Blood loss was less than 15 cc.  The patient ambulated out of the operating room having tolerated the procedure well.

## 2017-08-22 ENCOUNTER — Encounter: Payer: Self-pay | Admitting: Vascular Surgery

## 2019-06-27 HISTORY — PX: NECK SURGERY: SHX720

## 2019-07-15 ENCOUNTER — Ambulatory Visit: Payer: Medicare PPO | Attending: Internal Medicine

## 2019-07-15 DIAGNOSIS — Z23 Encounter for immunization: Secondary | ICD-10-CM | POA: Insufficient documentation

## 2019-07-15 NOTE — Progress Notes (Signed)
   Covid-19 Vaccination Clinic  Name:  Dana Caldwell    MRN: [id number] DOB: 10-06-1945  07/15/2019  [name] was observed post Covid-19 immunization for 15 minutes without incidence. She was provided with Vaccine Information Sheet and instruction to access the V-Safe system.   [name] was instructed to call 911 with any severe reactions post vaccine: Marland Kitchen Difficulty breathing  . Swelling of your face and throat  . A fast heartbeat  . A bad rash all over your body  . Dizziness and weakness    Immunizations Administered    Name Date Dose VIS Date Route   Pfizer COVID-19 Vaccine 07/15/2019 11:33 AM 0.3 mL 06/06/2019 Intramuscular   Manufacturer: [place]   Lot: [id number]   [place]: [id number]

## 2019-08-05 ENCOUNTER — Ambulatory Visit: Payer: Medicare PPO | Attending: Internal Medicine

## 2019-08-05 DIAGNOSIS — Z23 Encounter for immunization: Secondary | ICD-10-CM

## 2019-08-05 NOTE — Progress Notes (Signed)
   Covid-19 Vaccination Clinic  Name:  Dana Caldwell    MRN: [id number] DOB: Apr 29, 1946  08/05/2019  Ms. Dana Caldwell was observed post Covid-19 immunization for 15 minutes without incidence. She was provided with Vaccine Information Sheet and instruction to access the V-Safe system.   Ms. Dana Caldwell was instructed to call 911 with any severe reactions post vaccine: Marland Kitchen Difficulty breathing  . Swelling of your face and throat  . A fast heartbeat  . A bad rash all over your body  . Dizziness and weakness    Immunizations Administered    Name Date Dose VIS Date Route   Pfizer COVID-19 Vaccine 08/05/2019 11:51 AM 0.3 mL 06/06/2019 Intramuscular   Manufacturer: [place]   Lot: [id number]   [place]: [id number]

## 2019-08-17 ENCOUNTER — Ambulatory Visit: Payer: Medicare Other

## 2020-04-08 DIAGNOSIS — G43719 Chronic migraine without aura, intractable, without status migrainosus: Secondary | ICD-10-CM | POA: Diagnosis not present

## 2020-04-08 DIAGNOSIS — G518 Other disorders of facial nerve: Secondary | ICD-10-CM | POA: Diagnosis not present

## 2020-04-08 DIAGNOSIS — M542 Cervicalgia: Secondary | ICD-10-CM | POA: Diagnosis not present

## 2020-05-19 ENCOUNTER — Other Ambulatory Visit: Payer: Self-pay

## 2020-05-19 ENCOUNTER — Emergency Department (HOSPITAL_BASED_OUTPATIENT_CLINIC_OR_DEPARTMENT_OTHER): Payer: Medicare PPO

## 2020-05-19 ENCOUNTER — Emergency Department (HOSPITAL_COMMUNITY): Payer: Medicare PPO

## 2020-05-19 ENCOUNTER — Encounter (HOSPITAL_COMMUNITY): Admission: EM | Disposition: A | Discharge status: Home / Self Care | Payer: Self-pay | Source: Home / Self Care

## 2020-05-19 ENCOUNTER — Inpatient Hospital Stay (HOSPITAL_COMMUNITY): Payer: Medicare PPO | Admitting: Certified Registered Nurse Anesthetist

## 2020-05-19 ENCOUNTER — Encounter (HOSPITAL_BASED_OUTPATIENT_CLINIC_OR_DEPARTMENT_OTHER): Payer: Self-pay

## 2020-05-19 ENCOUNTER — Inpatient Hospital Stay (HOSPITAL_BASED_OUTPATIENT_CLINIC_OR_DEPARTMENT_OTHER)
Admission: EM | Admit: 2020-05-19 | Discharge: 2020-05-21 | DRG: 909 | Disposition: A | Discharge status: Home / Self Care | Payer: Medicare PPO | Attending: General Surgery | Admitting: General Surgery

## 2020-05-19 DIAGNOSIS — E785 Hyperlipidemia, unspecified: Secondary | ICD-10-CM | POA: Diagnosis present

## 2020-05-19 DIAGNOSIS — S0181XA Laceration without foreign body of other part of head, initial encounter: Secondary | ICD-10-CM | POA: Diagnosis not present

## 2020-05-19 DIAGNOSIS — Z833 Family history of diabetes mellitus: Secondary | ICD-10-CM | POA: Diagnosis not present

## 2020-05-19 DIAGNOSIS — J9600 Acute respiratory failure, unspecified whether with hypoxia or hypercapnia: Secondary | ICD-10-CM | POA: Diagnosis not present

## 2020-05-19 DIAGNOSIS — Z823 Family history of stroke: Secondary | ICD-10-CM

## 2020-05-19 DIAGNOSIS — W01110A Fall on same level from slipping, tripping and stumbling with subsequent striking against sharp glass, initial encounter: Secondary | ICD-10-CM | POA: Diagnosis present

## 2020-05-19 DIAGNOSIS — Z79899 Other long term (current) drug therapy: Secondary | ICD-10-CM

## 2020-05-19 DIAGNOSIS — Z880 Allergy status to penicillin: Secondary | ICD-10-CM | POA: Diagnosis not present

## 2020-05-19 DIAGNOSIS — Z820 Family history of epilepsy and other diseases of the nervous system: Secondary | ICD-10-CM

## 2020-05-19 DIAGNOSIS — W19XXXA Unspecified fall, initial encounter: Secondary | ICD-10-CM | POA: Diagnosis not present

## 2020-05-19 DIAGNOSIS — Z8249 Family history of ischemic heart disease and other diseases of the circulatory system: Secondary | ICD-10-CM

## 2020-05-19 DIAGNOSIS — S159XXA Injury of unspecified blood vessel at neck level, initial encounter: Principal | ICD-10-CM | POA: Diagnosis present

## 2020-05-19 DIAGNOSIS — Z978 Presence of other specified devices: Secondary | ICD-10-CM

## 2020-05-19 DIAGNOSIS — S0191XA Laceration without foreign body of unspecified part of head, initial encounter: Secondary | ICD-10-CM | POA: Diagnosis present

## 2020-05-19 DIAGNOSIS — Z23 Encounter for immunization: Secondary | ICD-10-CM | POA: Diagnosis not present

## 2020-05-19 DIAGNOSIS — E559 Vitamin D deficiency, unspecified: Secondary | ICD-10-CM | POA: Diagnosis not present

## 2020-05-19 DIAGNOSIS — G4730 Sleep apnea, unspecified: Secondary | ICD-10-CM | POA: Diagnosis present

## 2020-05-19 DIAGNOSIS — Y92019 Unspecified place in single-family (private) house as the place of occurrence of the external cause: Secondary | ICD-10-CM

## 2020-05-19 DIAGNOSIS — J3489 Other specified disorders of nose and nasal sinuses: Secondary | ICD-10-CM | POA: Diagnosis not present

## 2020-05-19 DIAGNOSIS — M7981 Nontraumatic hematoma of soft tissue: Secondary | ICD-10-CM | POA: Diagnosis not present

## 2020-05-19 DIAGNOSIS — G319 Degenerative disease of nervous system, unspecified: Secondary | ICD-10-CM | POA: Diagnosis not present

## 2020-05-19 DIAGNOSIS — J9811 Atelectasis: Secondary | ICD-10-CM | POA: Diagnosis not present

## 2020-05-19 DIAGNOSIS — Z882 Allergy status to sulfonamides status: Secondary | ICD-10-CM | POA: Diagnosis not present

## 2020-05-19 DIAGNOSIS — S1191XA Laceration without foreign body of unspecified part of neck, initial encounter: Secondary | ICD-10-CM | POA: Diagnosis not present

## 2020-05-19 DIAGNOSIS — S0990XA Unspecified injury of head, initial encounter: Secondary | ICD-10-CM | POA: Diagnosis not present

## 2020-05-19 DIAGNOSIS — S1181XA Laceration without foreign body of other specified part of neck, initial encounter: Secondary | ICD-10-CM | POA: Diagnosis not present

## 2020-05-19 DIAGNOSIS — R0603 Acute respiratory distress: Secondary | ICD-10-CM | POA: Diagnosis not present

## 2020-05-19 DIAGNOSIS — S1193XA Puncture wound without foreign body of unspecified part of neck, initial encounter: Secondary | ICD-10-CM

## 2020-05-19 DIAGNOSIS — R0602 Shortness of breath: Secondary | ICD-10-CM | POA: Diagnosis present

## 2020-05-19 DIAGNOSIS — Z803 Family history of malignant neoplasm of breast: Secondary | ICD-10-CM | POA: Diagnosis not present

## 2020-05-19 DIAGNOSIS — Z043 Encounter for examination and observation following other accident: Secondary | ICD-10-CM | POA: Diagnosis not present

## 2020-05-19 DIAGNOSIS — Z8051 Family history of malignant neoplasm of kidney: Secondary | ICD-10-CM

## 2020-05-19 DIAGNOSIS — Z4682 Encounter for fitting and adjustment of non-vascular catheter: Secondary | ICD-10-CM | POA: Diagnosis not present

## 2020-05-19 DIAGNOSIS — S1093XA Contusion of unspecified part of neck, initial encounter: Secondary | ICD-10-CM | POA: Diagnosis not present

## 2020-05-19 DIAGNOSIS — F418 Other specified anxiety disorders: Secondary | ICD-10-CM | POA: Diagnosis not present

## 2020-05-19 DIAGNOSIS — Z20822 Contact with and (suspected) exposure to covid-19: Secondary | ICD-10-CM | POA: Diagnosis present

## 2020-05-19 DIAGNOSIS — S0993XA Unspecified injury of face, initial encounter: Secondary | ICD-10-CM | POA: Diagnosis not present

## 2020-05-19 HISTORY — PX: WOUND EXPLORATION: SHX6188

## 2020-05-19 LAB — I-STAT ARTERIAL BLOOD GAS, ED
Acid-Base Excess: 1 mmol/L (ref 0.0–2.0)
Bicarbonate: 27.2 mmol/L (ref 20.0–28.0)
Calcium, Ion: 1.19 mmol/L (ref 1.15–1.40)
HCT: 35 % — ABNORMAL LOW (ref 36.0–46.0)
Hemoglobin: 11.9 g/dL — ABNORMAL LOW (ref 12.0–15.0)
O2 Saturation: 100 %
Patient temperature: 97.8
Potassium: 3.2 mmol/L — ABNORMAL LOW (ref 3.5–5.1)
Sodium: 139 mmol/L (ref 135–145)
TCO2: 29 mmol/L (ref 22–32)
pCO2 arterial: 45.6 mmHg (ref 32.0–48.0)
pH, Arterial: 7.381 (ref 7.350–7.450)
pO2, Arterial: 484 mmHg — ABNORMAL HIGH (ref 83.0–108.0)

## 2020-05-19 LAB — BASIC METABOLIC PANEL
Anion gap: 10 (ref 5–15)
BUN: 20 mg/dL (ref 8–23)
CO2: 27 mmol/L (ref 22–32)
Calcium: 9.3 mg/dL (ref 8.9–10.3)
Chloride: 103 mmol/L (ref 98–111)
Creatinine, Ser: 0.66 mg/dL (ref 0.44–1.00)
GFR, Estimated: >60 mL/min (ref >60)
Glucose, Bld: 101 mg/dL — ABNORMAL HIGH (ref 70–99)
Potassium: 4.1 mmol/L (ref 3.5–5.1)
Sodium: 140 mmol/L (ref 135–145)

## 2020-05-19 LAB — CBC
HCT: 38.7 % (ref 36.0–46.0)
Hemoglobin: 12.5 g/dL (ref 12.0–15.0)
MCH: 31.5 pg (ref 26.0–34.0)
MCHC: 32.3 g/dL (ref 30.0–36.0)
MCV: 97.5 fL (ref 80.0–100.0)
Platelets: 200 10*3/uL (ref 150–400)
RBC: 3.97 MIL/uL (ref 3.87–5.11)
RDW: 12.7 % (ref 11.5–15.5)
WBC: 13 10*3/uL — ABNORMAL HIGH (ref 4.0–10.5)
nRBC: 0 % (ref 0.0–0.2)

## 2020-05-19 LAB — COMPREHENSIVE METABOLIC PANEL
ALT: 19 U/L (ref 0–44)
AST: 28 U/L (ref 15–41)
Albumin: 3.8 g/dL (ref 3.5–5.0)
Alkaline Phosphatase: 45 U/L (ref 38–126)
Anion gap: 12 (ref 5–15)
BUN: 17 mg/dL (ref 8–23)
CO2: 21 mmol/L — ABNORMAL LOW (ref 22–32)
Calcium: 8.7 mg/dL — ABNORMAL LOW (ref 8.9–10.3)
Chloride: 106 mmol/L (ref 98–111)
Creatinine, Ser: 0.67 mg/dL (ref 0.44–1.00)
GFR, Estimated: >60 mL/min (ref >60)
Glucose, Bld: 116 mg/dL — ABNORMAL HIGH (ref 70–99)
Potassium: 3.4 mmol/L — ABNORMAL LOW (ref 3.5–5.1)
Sodium: 139 mmol/L (ref 135–145)
Total Bilirubin: 0.9 mg/dL (ref 0.3–1.2)
Total Protein: 5.9 g/dL — ABNORMAL LOW (ref 6.5–8.1)

## 2020-05-19 LAB — LACTIC ACID, PLASMA: Lactic Acid, Venous: 1.2 mmol/L (ref 0.5–1.9)

## 2020-05-19 LAB — RESP PANEL BY RT-PCR (FLU A&B, COVID) ARPGX2
Influenza A by PCR: NEGATIVE
Influenza B by PCR: NEGATIVE
SARS Coronavirus 2 by RT PCR: NEGATIVE

## 2020-05-19 LAB — ETHANOL: Alcohol, Ethyl (B): <10 mg/dL (ref <10)

## 2020-05-19 LAB — I-STAT CHEM 8, ED
BUN: 20 mg/dL (ref 8–23)
Calcium, Ion: 1.13 mmol/L — ABNORMAL LOW (ref 1.15–1.40)
Chloride: 105 mmol/L (ref 98–111)
Creatinine, Ser: 0.6 mg/dL (ref 0.44–1.00)
Glucose, Bld: 111 mg/dL — ABNORMAL HIGH (ref 70–99)
HCT: 39 % (ref 36.0–46.0)
Hemoglobin: 13.3 g/dL (ref 12.0–15.0)
Potassium: 3.3 mmol/L — ABNORMAL LOW (ref 3.5–5.1)
Sodium: 141 mmol/L (ref 135–145)
TCO2: 22 mmol/L (ref 22–32)

## 2020-05-19 LAB — PROTIME-INR
INR: 1 (ref 0.8–1.2)
Prothrombin Time: 12.9 seconds (ref 11.4–15.2)

## 2020-05-19 LAB — SAMPLE TO BLOOD BANK

## 2020-05-19 SURGERY — WOUND EXPLORATION
Anesthesia: General | Laterality: Right

## 2020-05-19 MED ORDER — LACTATED RINGERS IV SOLN: INTRAVENOUS | Status: DC | PRN

## 2020-05-19 MED ORDER — CHLORHEXIDINE GLUCONATE 0.12% ORAL RINSE (MEDLINE KIT)
15.0000 mL | Freq: Two times a day (BID) | OROMUCOSAL | Status: DC
  Administered 2020-05-20 (×2): 15 mL via OROMUCOSAL

## 2020-05-19 MED ORDER — CHLORHEXIDINE GLUCONATE CLOTH 2 % EX PADS
6.0000 | MEDICATED_PAD | Freq: Every day | CUTANEOUS | Status: DC
  Administered 2020-05-20 (×2): 6 via TOPICAL

## 2020-05-19 MED ORDER — MEPERIDINE HCL 25 MG/ML IJ SOLN: 6.2500 mg | INTRAMUSCULAR | Status: DC | PRN

## 2020-05-19 MED ORDER — POLYETHYLENE GLYCOL 3350 17 G PO PACK: 17.0000 g | PACK | Freq: Every day | ORAL | Status: DC

## 2020-05-19 MED ORDER — SUCCINYLCHOLINE CHLORIDE 20 MG/ML IJ SOLN
INTRAMUSCULAR | Status: AC | PRN
  Administered 2020-05-19: 100 mg via INTRAVENOUS

## 2020-05-19 MED ORDER — PROPOFOL 1000 MG/100ML IV EMUL
0.0000 ug/kg/min | INTRAVENOUS | Status: DC
  Administered 2020-05-19: 50 ug/kg/min via INTRAVENOUS

## 2020-05-19 MED ORDER — PROPOFOL 1000 MG/100ML IV EMUL
INTRAVENOUS | Status: AC | PRN
  Administered 2020-05-19: 20 ug/kg/min via INTRAVENOUS

## 2020-05-19 MED ORDER — MIDAZOLAM HCL 5 MG/5ML IJ SOLN
INTRAMUSCULAR | Status: DC | PRN
  Administered 2020-05-19: 2 mg via INTRAVENOUS

## 2020-05-19 MED ORDER — SODIUM CHLORIDE 0.9 % IR SOLN
Status: DC | PRN
  Administered 2020-05-19: 1000 mL

## 2020-05-19 MED ORDER — TETANUS-DIPHTH-ACELL PERTUSSIS 5-2.5-18.5 LF-MCG/0.5 IM SUSY
0.5000 mL | PREFILLED_SYRINGE | Freq: Once | INTRAMUSCULAR | Status: AC
  Administered 2020-05-19: 0.5 mL via INTRAMUSCULAR
  Filled 2020-05-19: qty 0.5

## 2020-05-19 MED ORDER — FENTANYL CITRATE (PF) 100 MCG/2ML IJ SOLN
25.0000 ug | INTRAMUSCULAR | Status: DC | PRN
  Filled 2020-05-19: qty 2

## 2020-05-19 MED ORDER — FENTANYL CITRATE (PF) 100 MCG/2ML IJ SOLN
100.0000 ug | Freq: Once | INTRAMUSCULAR | Status: AC
  Administered 2020-05-19: 100 ug via INTRAVENOUS
  Filled 2020-05-19: qty 2

## 2020-05-19 MED ORDER — FENTANYL CITRATE (PF) 100 MCG/2ML IJ SOLN
INTRAMUSCULAR | Status: AC | PRN
Start: 2020-05-19 — End: 2020-05-19
  Administered 2020-05-19: 200 ug via INTRAVENOUS

## 2020-05-19 MED ORDER — ORAL CARE MOUTH RINSE
15.0000 mL | OROMUCOSAL | Status: DC
  Administered 2020-05-20 (×3): 15 mL via OROMUCOSAL

## 2020-05-19 MED ORDER — ROCURONIUM BROMIDE 100 MG/10ML IV SOLN
INTRAVENOUS | Status: DC | PRN
  Administered 2020-05-19: 60 mg via INTRAVENOUS

## 2020-05-19 MED ORDER — PROPOFOL 1000 MG/100ML IV EMUL
0.0000 ug/kg/min | INTRAVENOUS | Status: DC
  Administered 2020-05-20: 50 ug/kg/min via INTRAVENOUS
  Filled 2020-05-19: qty 100

## 2020-05-19 MED ORDER — IOHEXOL 300 MG/ML  SOLN
100.0000 mL | Freq: Once | INTRAMUSCULAR | Status: AC | PRN
  Administered 2020-05-19: 100 mL via INTRAVENOUS

## 2020-05-19 MED ORDER — PROPOFOL 1000 MG/100ML IV EMUL
INTRAVENOUS | Status: AC
  Administered 2020-05-19: 50 ug/kg/min via INTRAVENOUS
  Filled 2020-05-19: qty 100

## 2020-05-19 MED ORDER — PROPOFOL 10 MG/ML IV BOLUS
INTRAVENOUS | Status: AC | PRN
  Administered 2020-05-19: 50 ug via INTRAVENOUS

## 2020-05-19 MED ORDER — BUPIVACAINE HCL (PF) 0.25 % IJ SOLN
INTRAMUSCULAR | Status: AC
  Filled 2020-05-19: qty 30

## 2020-05-19 MED ORDER — ETOMIDATE 2 MG/ML IV SOLN
INTRAVENOUS | Status: AC | PRN
  Administered 2020-05-19: 20 mg via INTRAVENOUS

## 2020-05-19 MED ORDER — LIDOCAINE-EPINEPHRINE 2 %-1:100000 IJ SOLN: 30.0000 mL | Freq: Once | INTRAMUSCULAR | Status: AC

## 2020-05-19 MED ORDER — FENTANYL CITRATE (PF) 100 MCG/2ML IJ SOLN
25.0000 ug | INTRAMUSCULAR | Status: DC | PRN
  Administered 2020-05-20 (×2): 25 ug via INTRAVENOUS
  Administered 2020-05-20: 100 ug via INTRAVENOUS
  Filled 2020-05-19 (×2): qty 2

## 2020-05-19 MED ORDER — FENTANYL CITRATE (PF) 250 MCG/5ML IJ SOLN
INTRAMUSCULAR | Status: AC
  Filled 2020-05-19: qty 5

## 2020-05-19 MED ORDER — DOCUSATE SODIUM 50 MG/5ML PO LIQD: 100.0000 mg | Freq: Two times a day (BID) | ORAL | Status: DC

## 2020-05-19 MED ORDER — PHENYLEPHRINE HCL-NACL 10-0.9 MG/250ML-% IV SOLN
INTRAVENOUS | Status: DC | PRN
  Administered 2020-05-19: 20 ug/min via INTRAVENOUS

## 2020-05-19 MED ORDER — BUPIVACAINE-EPINEPHRINE 0.25% -1:200000 IJ SOLN
INTRAMUSCULAR | Status: DC | PRN
  Administered 2020-05-19: 10 mL

## 2020-05-19 MED ORDER — DEXAMETHASONE SODIUM PHOSPHATE 10 MG/ML IJ SOLN
INTRAMUSCULAR | Status: DC | PRN
  Administered 2020-05-19: 10 mg via INTRAVENOUS

## 2020-05-19 MED ORDER — KETAMINE HCL 10 MG/ML IJ SOLN
INTRAMUSCULAR | Status: AC
  Administered 2020-05-19: 116 mg
  Filled 2020-05-19: qty 1

## 2020-05-19 MED ORDER — FENTANYL CITRATE (PF) 100 MCG/2ML IJ SOLN
INTRAMUSCULAR | Status: AC
  Filled 2020-05-19: qty 4

## 2020-05-19 MED ORDER — HYDROMORPHONE HCL 1 MG/ML IJ SOLN: 0.2500 mg | INTRAMUSCULAR | Status: DC | PRN

## 2020-05-19 MED ORDER — ONDANSETRON HCL 4 MG/2ML IJ SOLN: 4.0000 mg | Freq: Four times a day (QID) | INTRAMUSCULAR | Status: DC | PRN

## 2020-05-19 MED ORDER — PROPOFOL 10 MG/ML IV BOLUS
INTRAVENOUS | Status: AC
  Filled 2020-05-19: qty 20

## 2020-05-19 MED ORDER — CEFAZOLIN SODIUM-DEXTROSE 2-4 GM/100ML-% IV SOLN: 2.0000 g | Freq: Once | INTRAVENOUS | Status: DC

## 2020-05-19 MED ORDER — KETAMINE HCL 10 MG/ML IJ SOLN: 116.0000 mg | Freq: Once | INTRAMUSCULAR | Status: DC

## 2020-05-19 MED ORDER — PROCHLORPERAZINE MALEATE 10 MG PO TABS
10.0000 mg | ORAL_TABLET | Freq: Four times a day (QID) | ORAL | Status: DC | PRN
  Filled 2020-05-19: qty 1

## 2020-05-19 MED ORDER — ONDANSETRON HCL 4 MG/2ML IJ SOLN: 4.0000 mg | Freq: Once | INTRAMUSCULAR | Status: DC | PRN

## 2020-05-19 MED ORDER — PROCHLORPERAZINE EDISYLATE 10 MG/2ML IJ SOLN: 5.0000 mg | Freq: Four times a day (QID) | INTRAMUSCULAR | Status: DC | PRN

## 2020-05-19 MED ORDER — LIDOCAINE-EPINEPHRINE-TETRACAINE (LET) TOPICAL GEL
TOPICAL | Status: AC
  Filled 2020-05-19: qty 3

## 2020-05-19 MED ORDER — MIDAZOLAM HCL 2 MG/2ML IJ SOLN
INTRAMUSCULAR | Status: AC
  Filled 2020-05-19: qty 2

## 2020-05-19 MED ORDER — PHENYLEPHRINE HCL (PRESSORS) 10 MG/ML IV SOLN
INTRAVENOUS | Status: DC | PRN
  Administered 2020-05-19 (×2): 80 ug via INTRAVENOUS

## 2020-05-19 MED ORDER — ONDANSETRON HCL 4 MG/2ML IJ SOLN
INTRAMUSCULAR | Status: DC | PRN
  Administered 2020-05-19: 4 mg via INTRAVENOUS

## 2020-05-19 MED ORDER — ONDANSETRON 4 MG PO TBDP: 4.0000 mg | ORAL_TABLET | Freq: Four times a day (QID) | ORAL | Status: DC | PRN

## 2020-05-19 MED ORDER — CEFAZOLIN SODIUM-DEXTROSE 2-4 GM/100ML-% IV SOLN
2.0000 g | Freq: Once | INTRAVENOUS | Status: AC
  Administered 2020-05-19: 2 g via INTRAVENOUS

## 2020-05-19 SURGICAL SUPPLY — 31 items
ADH SKN CLS APL DERMABOND .7 (GAUZE/BANDAGES/DRESSINGS) ×1
COVER SURGICAL LIGHT HANDLE (MISCELLANEOUS) ×3 IMPLANT
DERMABOND ADVANCED (GAUZE/BANDAGES/DRESSINGS) ×2
DERMABOND ADVANCED .7 DNX12 (GAUZE/BANDAGES/DRESSINGS) IMPLANT
DRAIN WOUND SNY 15 RND (WOUND CARE) ×2 IMPLANT
DRAPE LAPAROSCOPIC ABDOMINAL (DRAPES) ×3 IMPLANT
ELECT REM PT RETURN 9FT ADLT (ELECTROSURGICAL) ×3
ELECTRODE REM PT RTRN 9FT ADLT (ELECTROSURGICAL) ×1 IMPLANT
EVACUATOR SILICONE 100CC (DRAIN) ×2 IMPLANT
GAUZE SPONGE 4X4 12PLY STRL (GAUZE/BANDAGES/DRESSINGS) ×2 IMPLANT
GLOVE SURG SIGNA 7.5 PF LTX (GLOVE) ×3 IMPLANT
GOWN STRL REUS W/ TWL LRG LVL3 (GOWN DISPOSABLE) ×1 IMPLANT
GOWN STRL REUS W/ TWL XL LVL3 (GOWN DISPOSABLE) ×1 IMPLANT
GOWN STRL REUS W/TWL LRG LVL3 (GOWN DISPOSABLE) ×3
GOWN STRL REUS W/TWL XL LVL3 (GOWN DISPOSABLE) ×3
KIT BASIN OR (CUSTOM PROCEDURE TRAY) ×3 IMPLANT
KIT TURNOVER KIT B (KITS) ×3 IMPLANT
MANIFOLD NEPTUNE II (INSTRUMENTS) ×2 IMPLANT
NDL 25GX 5/8IN NON SAFETY (NEEDLE) IMPLANT
NEEDLE 25GX 5/8IN NON SAFETY (NEEDLE) ×3 IMPLANT
NS IRRIG 1000ML POUR BTL (IV SOLUTION) ×3 IMPLANT
PACK GENERAL/GYN (CUSTOM PROCEDURE TRAY) ×3 IMPLANT
PENCIL SMOKE EVACUATOR (MISCELLANEOUS) ×3 IMPLANT
SLEEVE SCD COMPRESS THIGH MED (MISCELLANEOUS) ×2 IMPLANT
SPONGE LAP 18X18 X RAY DECT (DISPOSABLE) ×2 IMPLANT
SUT ETHILON 2 0 FS 18 (SUTURE) ×2 IMPLANT
SUT MNCRL AB 4-0 PS2 18 (SUTURE) ×2 IMPLANT
SUT VIC AB 2-0 SH 18 (SUTURE) ×2 IMPLANT
SUT VIC AB 3-0 SH 8-18 (SUTURE) ×2 IMPLANT
TOWEL GREEN STERILE (TOWEL DISPOSABLE) ×3 IMPLANT
TOWEL GREEN STERILE FF (TOWEL DISPOSABLE) ×3 IMPLANT

## 2020-05-19 NOTE — Progress Notes (Signed)
RT note. Pt. Transported to CT and back without any complications.

## 2020-05-19 NOTE — ED Notes (Signed)
Called [facility] spoke to [facility] @ 0720 ( Trauma) ( Transport Level 1)

## 2020-05-19 NOTE — ED Notes (Signed)
Pt arrived from [facility] via [facility], pt intubated pta. Given 116mg  ketamine and 269mcg fentanyl by [facility]. Propofol infusing at 64mcg/kg/hr on arrival.

## 2020-05-19 NOTE — Anesthesia Preprocedure Evaluation (Signed)
Anesthesia Evaluation  Patient identified by MRN, date of birth, ID band Patient unresponsive    Reviewed: Allergy & Precautions, NPO status , Patient's Chart, lab work & pertinent test results  Airway Mallampati: Intubated       Dental   Pulmonary sleep apnea ,    Pulmonary exam normal        Cardiovascular Normal cardiovascular exam     Neuro/Psych Anxiety Depression    GI/Hepatic   Endo/Other    Renal/GU      Musculoskeletal   Abdominal   Peds  Hematology   Anesthesia Other Findings   Reproductive/Obstetrics                             Anesthesia Physical Anesthesia Plan  ASA: III and emergent  Anesthesia Plan: General   Post-op Pain Management:    Induction: Intravenous  PONV Risk Score and Plan: Treatment may vary due to age or medical condition  Airway Management Planned: Oral ETT  Additional Equipment:   Intra-op Plan:   Post-operative Plan: Post-operative intubation/ventilation  Informed Consent: I have reviewed the patients History and Physical, chart, labs and discussed the procedure including the risks, benefits and alternatives for the proposed anesthesia with the patient or authorized representative who has indicated his/her understanding and acceptance.       Plan Discussed with: CRNA and Surgeon  Anesthesia Plan Comments:         Anesthesia Quick Evaluation

## 2020-05-19 NOTE — ED Provider Notes (Signed)
[facility] EMERGENCY DEPARTMENT Provider Note   CSN: [id number] Arrival date & time: 05/19/20  1837     History Chief Complaint  Patient presents with  . Facial Injury    Dana Caldwell is a 74 y.o. female with pertinent past medical history of arthritis that presents to the ER today for fall. Pt tripped and fell on a platter of food? and has big laceration to her chin. Is not on blood thinners. Pt has big hematoma to chin area, states that this happened 2 hours ago. Did not hit her head or anything else, pt states that she is finding it difficult to speak, no difficultly breathing at this time. Denies any head pain, vision changes, nausea, vomiting.  No difficulty swallowing at this time. Husband at bedside.   HPI     Past Medical History:  Diagnosis Date  . Allergic rhinitis   . Anxiety   . Anxiety disorder    controlled with meds  . Arthritis    thumbs  . Fibrocystic breast disease    right breast  . Hyperlipidemia    NMR 2007: LDL 222(3721/2730), HDL 52,TG 176). LDL goal +  < 115  . Migraine headache    Dr Domingo Cocking  . Sleep apnea    no c-pap/uses mouth piece    Patient Active Problem List   Diagnosis Date Noted  . Spider veins 06/04/2013  . Lipoma of back 02/15/2013  . Sciatic nerve pain 02/15/2013  . Osteopenia 10/27/2012  . Varicose veins of left lower extremity with complications 17/61/6073  . Colon polyp 04/26/2012  . Unspecified vitamin D deficiency 04/26/2012  . Sleep apnea 03/21/2011  . Family history of ischemic heart disease 09/14/2010  . Anxiety 09/14/2010  . ALLERGIC RHINITIS 04/22/2008  . Migraine, unspecified, without mention of intractable migraine without mention of status migrainosus 04/22/2008  . HYPERLIPIDEMIA 04/01/2007  . DISORDER, DEPRESSIVE NEC 04/01/2007    Past Surgical History:  Procedure Laterality Date  . BREAST LUMPECTOMY     right  . BREAST REDUCTION SURGERY  2008  . COLONOSCOPY  1999   Negative ; Dr Olevia Perches  .  COLONOSCOPY W/ POLYPECTOMY  2013   Tubular adenoma;Dr Olevia Perches  . KNEE SURGERY     right/ torn meniscus  . stab phlebectomy  Left 08/21/2017   stab phlebectomy 10-20 incisions left leg by Tinnie Gens MD  . TONSILLECTOMY    . VAGINAL DELIVERY     X 2     OB History   No obstetric history on file.     Family History  Problem Relation Age of Onset  . Stroke Father        > 17  . Multiple sclerosis Mother   . Heart attack Brother         two brothers;52& 30  . Stroke Maternal Grandfather 48  . Breast cancer Paternal Grandmother   . Coronary artery disease Brother        CBAG @ 15  . Diabetes Brother   . Stroke Brother   . Diabetes Paternal Uncle   . Heart attack Paternal Uncle        in 85s  . Renal cancer Brother        cns mets  . Colon cancer Neg Hx   . Esophageal cancer Neg Hx   . Stomach cancer Neg Hx   . Rectal cancer Neg Hx     Social History   Tobacco Use  . Smoking status: Never Smoker  .  Smokeless tobacco: Never Used  Substance Use Topics  . Alcohol use: Yes    Comment: rarely  . Drug use: No    Home Medications Prior to Admission medications   Medication Sig Start Date End Date Taking? Authorizing Provider  ALPRAZolam Duanne Moron) 0.25 MG tablet Takes 0/5 tablet as needed 09/03/13   Hendricks Limes, MD  baclofen (LIORESAL) 10 MG tablet Take 10 mg by mouth as needed for muscle spasms.    [provider]  buPROPion (WELLBUTRIN XL) 150 MG 24 hr tablet take 1 tablet by mouth once daily Patient taking differently: 150 mg. take 1/2 tablet by mouth once daily 07/16/14   Hendricks Limes, MD  cholecalciferol (VITAMIN D) 1000 UNITS tablet Take 1,000 Units by mouth daily.      [provider]  diclofenac (VOLTAREN) 50 MG EC tablet Take 1 tablet by mouth as needed. 08/19/13   [provider]  Diclofenac Sodium (PENNSAID) 1.5 % SOLN Place onto the skin as needed.      [provider]  ergotamine-caffeine (CAFERGOT) 1-100 MG per  tablet Take 2 tablets by mouth once. Two tablets at onset of attack; then 1 tablet every 30 minutes as needed; maximum: 6 tablets per attack; do not exceed 10 tablets/week     [provider]  ezetimibe-simvastatin (VYTORIN) 10-40 MG per tablet take 1 tablet by mouth once daily 08/17/14   Hendricks Limes, MD  fluticasone [facility]) 50 MCG/ACT nasal spray Place 2 sprays into both nostrils as needed.  08/19/13   [provider]  lidocaine (LIDODERM) 5 % Place 1 patch onto the skin daily. Remove & Discard patch within 12 hours or as directed by MD     [provider]  Melatonin 5 MG TABS Take 5 mg by mouth at bedtime as needed.     [provider]  metoCLOPramide (REGLAN) 10 MG tablet Take 10 mg by mouth as needed.    [provider]  naproxen (NAPROSYN) 250 MG tablet Take 250 mg by mouth as needed.     [provider]  niacin (NIASPAN) 500 MG CR tablet take 1 tablet by mouth at bedtime WITH APPLESAUCE 06/13/13   Hendricks Limes, MD  Omega-3 Fatty Acids (FISH OIL) 1000 MG CAPS Take 1,200 mg by mouth daily.     [provider]  OnabotulinumtoxinA (BOTOX IJ) Inject as directed. Botox injection every 3 months.    [provider]  raloxifene (EVISTA) 60 MG tablet Take 60 mg by mouth daily.      [provider]  tiZANidine (ZANAFLEX) 4 MG tablet Take 4 mg by mouth every 6 (six) hours as needed.    [provider]  TURMERIC PO Take by mouth 2 (two) times daily.    [provider]  zolpidem (AMBIEN) 5 MG tablet Take 5 mg by mouth at bedtime as needed for sleep.    [provider]    Allergies    Penicillins and Sulfonamide derivatives  Review of Systems   Review of Systems  Constitutional: Negative for chills, diaphoresis, fatigue and fever.  HENT: Negative for congestion, sore throat and trouble swallowing.   Eyes: Negative for pain and visual disturbance.  Respiratory: Negative for cough,  shortness of breath and wheezing.   Cardiovascular: Negative for chest pain, palpitations and leg swelling.  Gastrointestinal: Negative for abdominal distention, abdominal pain, diarrhea, nausea and vomiting.  Genitourinary: Negative for difficulty urinating.  Musculoskeletal: Negative for back pain, neck pain  and neck stiffness.  Skin: Positive for wound. Negative for pallor.  Neurological: Negative for dizziness, speech difficulty, weakness and headaches.  Psychiatric/Behavioral: Negative for confusion.    Physical Exam Updated Vital Signs BP (!) 144/82   Pulse 77   Temp 97.8 F (36.6 C)   Resp 18   Ht 5\' 5"  (1.651 m)   SpO2 100%   BMI 21.53 kg/m   Physical Exam Constitutional:      General: She is in acute distress.     Appearance: She is ill-appearing. She is not toxic-appearing or diaphoretic.  HENT:     Head:      Comments: 10 cm vertical laceration to chin, pulsatile blood with rapidly expanding hematoma to lower neck.     Mouth/Throat:     Mouth: Mucous membranes are moist.     Pharynx: Oropharynx is clear.  Eyes:     General: No scleral icterus.    Extraocular Movements: Extraocular movements intact.     Pupils: Pupils are equal, round, and reactive to light.  Cardiovascular:     Rate and Rhythm: Normal rate and regular rhythm.     Pulses: Normal pulses.     Heart sounds: Normal heart sounds.  Pulmonary:     Effort: Pulmonary effort is normal. No respiratory distress.     Breath sounds: Normal breath sounds. No stridor. No wheezing, rhonchi or rales.  Chest:     Chest wall: No tenderness.  Abdominal:     General: Abdomen is flat. There is no distension.     Palpations: Abdomen is soft.     Tenderness: There is no abdominal tenderness. There is no guarding or rebound.  Musculoskeletal:        General: No swelling or tenderness. Normal range of motion.     Cervical back: Normal range of motion and neck supple. No rigidity.     Right lower leg: No edema.      Left lower leg: No edema.  Skin:    General: Skin is warm and dry.     Capillary Refill: Capillary refill takes less than 2 seconds.     Coloration: Skin is not pale.  Neurological:     General: No focal deficit present.     Mental Status: She is alert and oriented to person, place, and time.  Psychiatric:        Mood and Affect: Mood normal.        Behavior: Behavior normal.       ED Results / Procedures / Treatments   Labs (all labs ordered are listed, but only abnormal results are displayed) Labs Reviewed  BASIC METABOLIC PANEL - Abnormal; Notable for the following components:      Result Value   Glucose, Bld 101 (*)    All other components within normal limits  RESP PANEL BY RT-PCR (FLU A&B, COVID) ARPGX2  COMPREHENSIVE METABOLIC PANEL  CBC  ETHANOL  URINALYSIS, ROUTINE W REFLEX MICROSCOPIC  LACTIC ACID, PLASMA  PROTIME-INR  I-STAT CHEM 8, ED  SAMPLE TO BLOOD BANK    EKG None  Radiology DG Chest Port 1 View  Result Date: 05/19/2020 CLINICAL DATA:  Neck injury intubated EXAM: PORTABLE CHEST 1 VIEW COMPARISON:  None. FINDINGS: Endotracheal tube tip is about 1.7 cm superior to carina. Mild elevation left diaphragm with atelectasis. Right lung is clear. Normal heart size. No pneumothorax IMPRESSION: 1. Endotracheal tube tip about 1.7 cm superior to carina. 2. Elevated left diaphragm with mild atelectasis at the left  base. Electronically Signed   By: Donavan Foil M.D.   On: 05/19/2020 20:20    Procedures .Critical Care Performed by: Alfredia Client, PA-C Authorized by: Alfredia Client, PA-C   Critical care provider statement:    Critical care time (minutes):  45   Critical care was time spent personally by me on the following activities:  Discussions with consultants, evaluation of patient's response to treatment, examination of patient, ordering and performing treatments and interventions, ordering and review of laboratory studies, ordering and review of radiographic  studies, pulse oximetry, re-evaluation of patient's condition, obtaining history from patient or surrogate and review of old charts   (including critical care time)  Medications Ordered in ED Medications  lidocaine-EPINEPHrine (XYLOCAINE W/EPI) 2 %-1:100000 (with pres) injection 30 mL (has no administration in time range)  iohexol (OMNIPAQUE) 300 MG/ML solution 100 mL (has no administration in time range)  propofol (DIPRIVAN) 1000 MG/100ML infusion (has no administration in time range)  fentaNYL (SUBLIMAZE) 100 MCG/2ML injection (has no administration in time range)  ketamine (KETALAR) injection 116 mg (has no administration in time range)  ceFAZolin (ANCEF) IVPB 2g/100 mL premix (2 g Intravenous New Bag/Given 05/19/20 2108)  fentaNYL (SUBLIMAZE) injection 100 mcg (100 mcg Intravenous Given 05/19/20 1856)  Tdap (BOOSTRIX) injection 0.5 mL (0.5 mLs Intramuscular Given 05/19/20 1902)  etomidate (AMIDATE) injection (20 mg Intravenous Given 05/19/20 1946)  succinylcholine (ANECTINE) injection (100 mg Intravenous Given 05/19/20 1948)  propofol (DIPRIVAN) 10 mg/mL bolus/IV push (50 mcg Intravenous Given 05/19/20 1957)  fentaNYL (SUBLIMAZE) injection (200 mcg Intravenous Given 05/19/20 2001)  propofol (DIPRIVAN) 1000 MG/100ML infusion (30 mcg/kg/min  58 kg (Order-Specific) Intravenous Rate/Dose Change 05/19/20 2010)  ketamine (KETALAR) 10 MG/ML injection (116 mg  Given 05/19/20 2016)    ED Course  I have reviewed the triage vital signs and the nursing notes.  Pertinent labs & imaging results that were available during my care of the patient were reviewed by me and considered in my medical decision making (see chart for details).    MDM Rules/Calculators/A&P                         700Pt with large laceration with arterial blood to chin with rapidly expanding hematoma, is not on blood thinners. Airway currently intact, no difficulty breathing, however if hematoma keeps expanding I am concerned  for airway compromise. Bleeding controlled with combat guaze,pain controlled with fentanyl. Will obtain CT scan to evaluate airway, and then reacess.   800 Pt started having stridor and difficulty breathing with labored respirations. Hematoma continuously expanding. Has not obtained CT yet. She states that she is having difficulty swallowing now. Dr. Billy Fischer advised, will plan on intubation at this time.   Dr. Billy Fischer preformed intubation sucessfully, pt to be transferred to [facility]. Trauma consulted and on board.  Dr. Maryan Rued accepting physician. Care link ready for transfer.   The patient appears reasonably stabilized for admission considering the current resources, flow, and capabilities available in the ED at this time, and I doubt any other [facility] requiring further screening and/or treatment in the ED prior to admission.  I discussed this case with my attending physician who cosigned this note including patient's presenting symptoms, physical exam, and planned diagnostics and interventions. Attending physician stated agreement with plan or made changes to plan which were implemented.   Attending physician assessed patient at bedside.  Final Clinical Impression(s) / ED Diagnoses Final diagnoses:  Penetrating traumatic injury of neck  Rx / DC Orders ED Discharge Orders    None       Alfredia Client, Hershal Coria 05/19/20 2110    Gareth Morgan, MD 05/20/20 1123

## 2020-05-19 NOTE — Op Note (Signed)
Patient: Dana Caldwell ([birth year], [id number])  Date of Surgery: 05/19/2020   Preoperative Diagnosis: Neck trauma with expanding hematoma  Postoperative Diagnosis: Neck trauma with expanding hematoma  Surgical Procedure:  NECK WOUND EXPLORATION CAUTERY OF BLEEDING VESSEL SUBCUTANEOUS DRAIN PLACEMENT  Operative Team Members:  Surgeon(s) and Role:    * Nashayla Telleria, Nickola Major, MD - Primary   Anesthesiologist: Lillia Abed, MD CRNA: Josephine Igo, CRNA   Anesthesia: General    Fluids:  Crystalloid  Complications: None  Drains:  (15 fr ) Jackson-Pratt drain(s) with closed bulb suction in the hematoma cavity in the right neck   Specimen: * No specimens in log *   Disposition:  ICU - intubated and hemodynamically stable.  Plan of Care: Admit to inpatient     Indications for Procedure: Dana Caldwell is a 74 y.o. female who presented with blunt trauma to the chin with a laceration and an expanding hematoma.  She was intubated at the outside ER prior to arrival as a level 1 trauma.  Extravasation was noted on CTA of the neck so the decision was made to proceed emergently to the operating room for neck exploration.  Notably a CT of the C-spine was negative for any acute traumatic findings so the c-collar that was placed in the trauma bay was removed prior to surgery.  Findings: Midline laceration with hematoma extending into the right neck.  Bleeding vessel just below the laceration controlled with cautery.  Description of Procedure: On the date stated above the patient was taken the operating room suite and placed in supine position.  A bump was placed under the patient's shoulders and the patient's neck was extended with the chin pointing a little to the left.  The patient already been intubated and sedated, they were connected to the anesthesia machine and monitors.  Antibiotics were given prior to the case start, SCDs were placed on the patient's lower extremities to prevent blood  clots, and the field was prepped and draped in usual sterile fashion.  A timeout was completed verifying the correct patient, procedure, positioning, and equipment needed for the case.  We began by extending the laceration inferiorly down the midline of the neck.  Platysma was divided using electrocautery.  The hematoma was entered and explored.  The hematoma had dissected down to the trachea and down into the right-sided structures of the neck.  About 200 cc of bright red blood and clots was able to be removed from the hematoma cavity.  There were bleeding vessels just under the location of the laceration which were controlled using electrocautery.  The wound was packed with gauze and pressure was held.  We then remove the gauze and there was good hemostasis.  To prevent a hematoma from recollecting we side to place a drain in the hematoma cavity.  A 15 round JP drain was brought through the right neck and tunneled into the hematoma cavity.  This was fixed to the skin using a 2-0 nylon suture.  We then directed our attention to closure.  Platysma was reapproximated using 3-0 Vicryl suture.  The deep dermis was reapproximated using 3-0 Vicryl suture.  The skin of the laceration and the extended incision was closed using 4-0 Monocryl and Dermabond.  Local anesthetic was instilled around the incision of the end of the case.  All sponge needle counts were correct on this case.  The patient was transferred the ICU intubated.   Louanna Raw, MD General, Bariatric, & Minimally Invasive Surgery  [facility], [place]

## 2020-05-19 NOTE — H&P (Signed)
Admitting Physician: Dana Caldwell  Service: Trauma Surgery  CC: Neck trauma  Subjective   Mechanism of Injury: Dana Caldwell is an 74 y.o. female who presented as a level one trauma after a neck trauma. She was walking on the stairs when she tripped on something sitting on the stairs falling hitting her chin on the bar the bottom of the stairs.  She had a large amount of bleeding from her chin and ran to multiple different ERs before being transferred here as a level one trauma transfer after being intubated at the [facility] emergency room.   Past Medical History:  Diagnosis Date  . Allergic rhinitis   . Anxiety   . Anxiety disorder    controlled with meds  . Arthritis    thumbs  . Fibrocystic breast disease    right breast  . Hyperlipidemia    NMR 2007: LDL 222(3721/2730), HDL 52,TG 176). LDL goal +  < 115  . Migraine headache    Dr Domingo Cocking  . Sleep apnea    no c-pap/uses mouth piece    Past Surgical History:  Procedure Laterality Date  . BREAST LUMPECTOMY     right  . BREAST REDUCTION SURGERY  2008  . COLONOSCOPY  1999   Negative ; Dr Olevia Perches  . COLONOSCOPY W/ POLYPECTOMY  2013   Tubular adenoma;Dr Olevia Perches  . KNEE SURGERY     right/ torn meniscus  . stab phlebectomy  Left 08/21/2017   stab phlebectomy 10-20 incisions left leg by Tinnie Gens MD  . TONSILLECTOMY    . VAGINAL DELIVERY     X 2    Family History  Problem Relation Age of Onset  . Stroke Father        > 74  . Multiple sclerosis Mother   . Heart attack Brother         two brothers;52& 51  . Stroke Maternal Grandfather 49  . Breast cancer Paternal Grandmother   . Coronary artery disease Brother        CBAG @ 63  . Diabetes Brother   . Stroke Brother   . Diabetes Paternal Uncle   . Heart attack Paternal Uncle        in 60s  . Renal cancer Brother        cns mets  . Colon cancer Neg Hx   . Esophageal cancer Neg Hx   . Stomach cancer Neg Hx   . Rectal cancer Neg Hx     Social:   reports that she has never smoked. She has never used smokeless tobacco. She reports current alcohol use. She reports that she does not use drugs.  Allergies:  Allergies  Allergen Reactions  . Penicillins     Hives while on PCN AND Sulfa  . Sulfonamide Derivatives     Hives while on PCN AND Sulfa    Medications: Current Outpatient Medications  Medication Instructions  . ALPRAZolam (XANAX) 0.25 MG tablet Takes 0/5 tablet as needed  . baclofen (LIORESAL) 10 mg, Oral, As needed  . buPROPion (WELLBUTRIN XL) 150 MG 24 hr tablet take 1 tablet by mouth once daily  . cholecalciferol (VITAMIN D) 1,000 Units, Daily  . diclofenac (VOLTAREN) 50 MG EC tablet 1 tablet, As needed  . Diclofenac Sodium (PENNSAID) 1.5 % SOLN As needed  . ergotamine-caffeine (CAFERGOT) 1-100 MG per tablet 2 tablets,  Once  . ezetimibe-simvastatin (VYTORIN) 10-40 MG per tablet take 1 tablet by mouth once daily  .  Fish Oil 1,200 mg, Oral, Daily  . fluticasone (FLONASE) 50 MCG/ACT nasal spray 2 sprays, Each Nare, As needed  . lidocaine (LIDODERM) 5 % 1 patch, Every 24 hours  . melatonin 5 mg, Oral, At bedtime PRN  . metoCLOPramide (REGLAN) 10 mg, As needed  . naproxen (NAPROSYN) 250 mg, As needed  . niacin (NIASPAN) 500 MG CR tablet take 1 tablet by mouth at bedtime WITH APPLESAUCE  . OnabotulinumtoxinA (BOTOX IJ) Injection, Botox injection every 3 months.   . raloxifene (EVISTA) 60 mg, Daily  . tiZANidine (ZANAFLEX) 4 mg, Every 6 hours PRN  . TURMERIC PO Oral, 2 times daily  . zolpidem (AMBIEN) 5 mg, Oral, At bedtime PRN    Objective   Primary Survey: Blood pressure 125/75, pulse 67, temperature 99.1 F (37.3 C), resp. rate 16, height 5\' 5"  (1.651 m), weight 59 kg, SpO2 100 %. Airway: ET tube in place, Breathing: Bilateral breath sounds, ventilated Circulation: Stable, Palpable peripheral pulses Disability: sedated, GCS 3T Environment/Exposure: Warm, dry  Primary Survey Adjuncts: None  Secondary  Survey: Head: Normocephalic, atraumatic (see neck exam) Neck: neck wound bandaged, removed.  There is a 3 cm laceration in the midline below the chin, may partially penetrate the platysma appears intact,  Hematoma present, not actively bleeding or expanding since bandage has been placed.   Chest: Bilateral breath sounds, chest wall stable Abdomen: Soft, non-tender, non-distended Upper Extremities: Atraumatic, palpable peripheral pulses Lower extremities: Atraumatic, palpable peripheral pulses Back: No step offs or deformities, atraumatic  Procedures performed in the trauma bay: Foley catheter placement  Results for orders placed or performed during the hospital encounter of 05/19/20 (from the past 24 hour(s))  Basic metabolic panel     Status: Abnormal   Collection Time: 05/19/20  7:00 PM  Result Value Ref Range   Sodium 140 135 - 145 mmol/L   Potassium 4.1 3.5 - 5.1 mmol/L   Chloride 103 98 - 111 mmol/L   CO2 27 22 - 32 mmol/L   Glucose, Bld 101 (H) 70 - 99 mg/dL   BUN 20 8 - 23 mg/dL   Creatinine, Ser 0.66 0.44 - 1.00 mg/dL   Calcium 9.3 8.9 - 10.3 mg/dL   GFR, Estimated >60 >60 mL/min   Anion gap 10 5 - 15  Resp Panel by RT-PCR (Flu A&B, Covid) Nasopharyngeal Swab     Status: None   Collection Time: 05/19/20  7:29 PM   Specimen: Nasopharyngeal Swab; Nasopharyngeal(NP) swabs in vial transport medium  Result Value Ref Range   SARS Coronavirus 2 by RT PCR NEGATIVE NEGATIVE   Influenza A by PCR NEGATIVE NEGATIVE   Influenza B by PCR NEGATIVE NEGATIVE  Comprehensive metabolic panel     Status: Abnormal   Collection Time: 05/19/20  9:06 PM  Result Value Ref Range   Sodium 139 135 - 145 mmol/L   Potassium 3.4 (L) 3.5 - 5.1 mmol/L   Chloride 106 98 - 111 mmol/L   CO2 21 (L) 22 - 32 mmol/L   Glucose, Bld 116 (H) 70 - 99 mg/dL   BUN 17 8 - 23 mg/dL   Creatinine, Ser 0.67 0.44 - 1.00 mg/dL   Calcium 8.7 (L) 8.9 - 10.3 mg/dL   Total Protein 5.9 (L) 6.5 - 8.1 g/dL   Albumin 3.8 3.5  - 5.0 g/dL   AST 28 15 - 41 U/L   ALT 19 0 - 44 U/L   Alkaline Phosphatase 45 38 - 126 U/L   Total Bilirubin 0.9  0.3 - 1.2 mg/dL   GFR, Estimated >60 >60 mL/min   Anion gap 12 5 - 15  CBC     Status: Abnormal   Collection Time: 05/19/20  9:06 PM  Result Value Ref Range   WBC 13.0 (H) 4.0 - 10.5 K/uL   RBC 3.97 3.87 - 5.11 MIL/uL   Hemoglobin 12.5 12.0 - 15.0 g/dL   HCT 38.7 36 - 46 %   MCV 97.5 80.0 - 100.0 fL   MCH 31.5 26.0 - 34.0 pg   MCHC 32.3 30.0 - 36.0 g/dL   RDW 12.7 11.5 - 15.5 %   Platelets 200 150 - 400 K/uL   nRBC 0.0 0.0 - 0.2 %  Ethanol     Status: None   Collection Time: 05/19/20  9:06 PM  Result Value Ref Range   Alcohol, Ethyl (B) <10 <10 mg/dL  Lactic acid, plasma     Status: None   Collection Time: 05/19/20  9:06 PM  Result Value Ref Range   Lactic Acid, Venous 1.2 0.5 - 1.9 mmol/L  Protime-INR     Status: None   Collection Time: 05/19/20  9:06 PM  Result Value Ref Range   Prothrombin Time 12.9 11.4 - 15.2 seconds   INR 1.0 0.8 - 1.2  Sample to Blood Bank     Status: None   Collection Time: 05/19/20  9:06 PM  Result Value Ref Range   Blood Bank Specimen SAMPLE AVAILABLE FOR TESTING    Sample Expiration      05/20/2020,2359 Performed at [facility], [address] [place], [place] [address]   I-Stat Chem 8, ED     Status: Abnormal   Collection Time: 05/19/20  9:14 PM  Result Value Ref Range   Sodium 141 135 - 145 mmol/L   Potassium 3.3 (L) 3.5 - 5.1 mmol/L   Chloride 105 98 - 111 mmol/L   BUN 20 8 - 23 mg/dL   Creatinine, Ser 0.60 0.44 - 1.00 mg/dL   Glucose, Bld 111 (H) 70 - 99 mg/dL   Calcium, Ion 1.13 (L) 1.15 - 1.40 mmol/L   TCO2 22 22 - 32 mmol/L   Hemoglobin 13.3 12.0 - 15.0 g/dL   HCT 39.0 36 - 46 %  I-Stat arterial blood gas, ED     Status: Abnormal   Collection Time: 05/19/20 10:15 PM  Result Value Ref Range   pH, Arterial 7.381 7.35 - 7.45   pCO2 arterial 45.6 32 - 48 mmHg   pO2, Arterial 484 (H) 83 - 108 mmHg    Bicarbonate 27.2 20.0 - 28.0 mmol/L   TCO2 29 22 - 32 mmol/L   O2 Saturation 100.0 %   Acid-Base Excess 1.0 0.0 - 2.0 mmol/L   Sodium 139 135 - 145 mmol/L   Potassium 3.2 (L) 3.5 - 5.1 mmol/L   Calcium, Ion 1.19 1.15 - 1.40 mmol/L   HCT 35.0 (L) 36 - 46 %   Hemoglobin 11.9 (L) 12.0 - 15.0 g/dL   Patient temperature 97.8 F    Collection site Radial    Drawn by RT    Sample type ARTERIAL      Imaging Orders     DG Chest Port 1 View     CT Angio Neck W and/or Wo Contrast     CT Head Wo Contrast     CT Cervical Spine Wo Contrast     CT Maxillofacial Wo Contrast     DG Chest [facility] 1 [address]  Assessment and Plan   Dana Caldwell is an 74 y.o. female who presented as a level one trauma after a neck trauma. She was walking on the stairs when she tripped on something sitting on the stairs falling hitting her chin on the bar the bottom of the stairs.  She had a large amount of bleeding from her chin and ran to multiple different ERs before being transferred here as a level one trauma transfer after being intubated at the [facility] emergency room.    Injuries: Blunt/penetrating trauma to the neck with expanding hematoma and airway compromise -proceed to the operating room for emergent neck exploration  Dispo -ICU postop  Louanna Raw, M.D. General, Bariatric and Minimally Invasive Surgery  [facility] Use [email or web address] to contact on call provider

## 2020-05-19 NOTE — ED Notes (Addendum)
Patient c/o difficulty breathing.  Os sat is 100% on RA.  EDPs at bedside, dressing removed and checked by EDPs.  Laceration underneath patient's chin is open and bleeding.

## 2020-05-19 NOTE — ED Provider Notes (Signed)
Patient sent from [facility] for evaluation of a neck injury.  Patient apparently fell and struck the underside of her chin/upper neck on the corner of a table.  She has a laceration to this area.  She was also felt to have swelling and change in her voice at [facility]  Patient was intubated for airway protection, then transported here for trauma evaluation.  Patient arrives here hemodynamically stable being ventilated with ET tube in place.  She does have a laceration to the neck with some swelling present.  Patient to undergo CT scanning of the neck, head and will be admitted to the trauma service for further monitoring.   Veryl Speak, MD 05/19/20 2206

## 2020-05-19 NOTE — ED Triage Notes (Addendum)
Pt states she fell at home-struck chin to ?object-large jagged lac noted-PA at BS-pt states she was sent from [facility]-constant bleeding at site-NAD-taken to tx area w/c-husband states pt fell ~430pm

## 2020-05-19 NOTE — Progress Notes (Signed)
RT note. Pt. Transported to OR without any complications. Vent brought to 4N Rm 25.

## 2020-05-20 ENCOUNTER — Inpatient Hospital Stay (HOSPITAL_COMMUNITY): Payer: Medicare PPO

## 2020-05-20 LAB — MRSA PCR SCREENING: MRSA by PCR: NEGATIVE

## 2020-05-20 LAB — CBC
HCT: 36.8 % (ref 36.0–46.0)
Hemoglobin: 12.3 g/dL (ref 12.0–15.0)
MCH: 32.5 pg (ref 26.0–34.0)
MCHC: 33.4 g/dL (ref 30.0–36.0)
MCV: 97.4 fL (ref 80.0–100.0)
Platelets: 196 10*3/uL (ref 150–400)
RBC: 3.78 MIL/uL — ABNORMAL LOW (ref 3.87–5.11)
RDW: 12.9 % (ref 11.5–15.5)
WBC: 10.2 10*3/uL (ref 4.0–10.5)
nRBC: 0 % (ref 0.0–0.2)

## 2020-05-20 LAB — BASIC METABOLIC PANEL
Anion gap: 10 (ref 5–15)
BUN: 15 mg/dL (ref 8–23)
CO2: 25 mmol/L (ref 22–32)
Calcium: 8.7 mg/dL — ABNORMAL LOW (ref 8.9–10.3)
Chloride: 103 mmol/L (ref 98–111)
Creatinine, Ser: 0.73 mg/dL (ref 0.44–1.00)
GFR, Estimated: >60 mL/min (ref >60)
Glucose, Bld: 139 mg/dL — ABNORMAL HIGH (ref 70–99)
Potassium: 3.7 mmol/L (ref 3.5–5.1)
Sodium: 138 mmol/L (ref 135–145)

## 2020-05-20 LAB — URINALYSIS, ROUTINE W REFLEX MICROSCOPIC
Bilirubin Urine: NEGATIVE
Glucose, UA: NEGATIVE mg/dL
Hgb urine dipstick: NEGATIVE
Ketones, ur: 5 mg/dL — AB
Leukocytes,Ua: NEGATIVE
Nitrite: NEGATIVE
Protein, ur: NEGATIVE mg/dL
Specific Gravity, Urine: >1.046 — ABNORMAL HIGH (ref 1.005–1.030)
pH: 6 (ref 5.0–8.0)

## 2020-05-20 LAB — TRIGLYCERIDES: Triglycerides: 46 mg/dL (ref <150)

## 2020-05-20 MED ORDER — ACETAMINOPHEN 325 MG PO TABS
650.0000 mg | ORAL_TABLET | Freq: Four times a day (QID) | ORAL | Status: DC | PRN
  Administered 2020-05-20 – 2020-05-21 (×4): 650 mg via ORAL
  Filled 2020-05-20 (×4): qty 2

## 2020-05-20 MED ORDER — DOCUSATE SODIUM 100 MG PO CAPS: 100.0000 mg | ORAL_CAPSULE | Freq: Two times a day (BID) | ORAL | Status: DC

## 2020-05-20 MED ORDER — SODIUM CHLORIDE 0.9 % IV SOLN: INTRAVENOUS | Status: DC

## 2020-05-20 MED ORDER — ALPRAZOLAM 0.5 MG PO TABS
0.2500 mg | ORAL_TABLET | Freq: Every evening | ORAL | Status: DC | PRN
  Administered 2020-05-20: 0.25 mg via ORAL
  Filled 2020-05-20: qty 1

## 2020-05-20 NOTE — Progress Notes (Addendum)
Patient arrived to 4N25 with only a pair of spade earrings which are in a bag on the computer by the keyboard...Marland KitchenMarland KitchenHusband took all of her belonings home prior to OR.  I.e. no cell phone or purse are present in room.

## 2020-05-20 NOTE — Anesthesia Postprocedure Evaluation (Signed)
Anesthesia Post Note  Patient: Dana Caldwell  Procedure(s) Performed: RIGHT NECK EXPLORATION (Right )     Patient location during evaluation: SICU Anesthesia Type: General Level of consciousness: patient remains intubated per anesthesia plan Pain management: pain level controlled Vital Signs Assessment: post-procedure vital signs reviewed and stable Respiratory status: patient on ventilator - see flowsheet for VS Cardiovascular status: blood pressure returned to baseline and stable Postop Assessment: no apparent nausea or vomiting Anesthetic complications: no   No complications documented.  Last Vitals:  Vitals:   05/20/20 0100 05/20/20 0200  BP: (!) 83/54 96/61  Pulse: 72 71  Resp: 16 16  Temp: 36.7 C 36.9 C  SpO2: 100% 100%    Last Pain:  Vitals:   05/19/20 1853  TempSrc: Oral  PainSc:                  Garnet Chatmon DAVID

## 2020-05-20 NOTE — ED Provider Notes (Signed)
  Physical Exam  BP (!) 88/54   Pulse 89   Temp 100 F (37.8 C)   Resp 16   Ht 5\' 5"  (1.651 m)   Wt 59 kg   SpO2 100%   BMI 21.63 kg/m   Physical Exam  ED Course/Procedures     .Critical Care Performed by: Gareth Morgan, MD Authorized by: Gareth Morgan, MD   Critical care provider statement:    Critical care time (minutes):  30   Critical care was time spent personally by me on the following activities:  Discussions with consultants, evaluation of patient's response to treatment, examination of patient, ordering and performing treatments and interventions, ordering and review of laboratory studies, ordering and review of radiographic studies, pulse oximetry, re-evaluation of patient's condition, obtaining history from patient or surrogate and review of old charts Procedure Name: Intubation Date/Time: 05/20/2020 11:14 AM Performed by: Gareth Morgan, MD Pre-anesthesia Checklist: Patient identified, Patient being monitored, Emergency Drugs available, Timeout performed and Suction available Oxygen Delivery Method: Non-rebreather mask Preoxygenation: Pre-oxygenation with 100% oxygen Induction Type: Rapid sequence Ventilation: Mask ventilation without difficulty Laryngoscope Size: Glidescope Grade View: Grade I Tube size: 7.0 mm Number of attempts: 1 Placement Confirmation: ETT inserted through vocal cords under direct vision,  CO2 detector and Breath sounds checked- equal and bilateral Difficulty Due To: Difficulty was anticipated and Difficult Airway- due to large tongue Comments: Expanding neck hematoma with significant submandibular swelling and elevation of tongue  Glidescope       MDM  74yo female who presents with fall hitting her neck on a table with laceration and expanding hematoma.  Initial exam with bleeding controlled, small hematoma present, no stridor, some dysphagia--IV access established, pressure dressing placed, plan for imaging however on  reevaluation has rapidly expanding hematoma, signs of arterial bleed, dysphonia, dysphagia and developing dyspnea.    Given expected course with rapidly expanding neck hematoma and concern for developing and impending airway compromise, discussed intubation with patient and husband.  Anticipated difficult airway with equipment to bedside, called [facility] and surgery and upgraded patient to Level 1 trauma. Intubation performed. Pt required additional ketamine for sedation due to discomfort and awakening with initial propofol gtt.  Transferred emergently to [facility] for trauma surgery evaluation.      Gareth Morgan, MD 05/20/20 1122

## 2020-05-20 NOTE — Transfer of Care (Signed)
Immediate Anesthesia Transfer of Care Note  Patient: Daneen Volcy  Procedure(s) Performed: RIGHT NECK EXPLORATION (Right )  Patient Location: 4N ICU  Anesthesia Type:General  Level of Consciousness: sedated and Patient remains intubated per anesthesia plan  Airway & Oxygen Therapy: Patient remains intubated per anesthesia plan  Post-op Assessment: Report given to RN and Post -op Vital signs reviewed and stable  Post vital signs: Reviewed and stable  Last Vitals:  Vitals Value Taken Time  BP 187/83 05/20/20 0000  Temp    Pulse 82 05/20/20 0005  Resp 16 05/20/20 0005  SpO2 100 % 05/20/20 0005  Vitals shown include unvalidated device data.  Last Pain:  Vitals:   05/19/20 1853  TempSrc: Oral  PainSc:          Complications: No complications documented.

## 2020-05-20 NOTE — Procedures (Signed)
Extubation Procedure Note  Patient Details:   Name: Dana Caldwell DOB: 04-05-1946 MRN: [id number]   Airway Documentation:    Vent end date: 05/20/20 Vent end time: 0915   Evaluation  O2 sats: stable throughout Complications: No apparent complications Patient did tolerate procedure well. Bilateral Breath Sounds: Clear   Yes   Pt extubated to 3L [place] per MD order. Pt stable throughout with no complications. Pt VS WNL. Pt with copious amounts of secretions spit up post extubations.   Jesse Sans 05/20/2020, 9:24 AM

## 2020-05-20 NOTE — Progress Notes (Signed)
Patient ID: Dana Caldwell, female   DOB: 22-Jul-1945, 73 y.o.   MRN: [id number] Follow up - Trauma Critical Care  Patient Details:    Dana Caldwell is an 74 y.o. female.  Lines/tubes : Airway 7 mm (Active)  Secured at (cm) 23 cm 05/20/20 0400  Measured From Lips 05/20/20 0400  [place] 05/20/20 0347  Secured By [employer] 05/20/20 0347  Tube Holder Repositioned Yes 05/20/20 0347  Prone position No 05/20/20 0347  Head position Left 05/19/20 1951  Cuff Pressure (cm H2O) 24 cm H2O 05/20/20 0005  Site Condition Dry;Edema 05/19/20 2102     Closed System Drain Anterior Neck Bulb (JP) 15 Fr. (Active)  Site Description Unable to view 05/20/20 0000  Dressing Status Clean;Dry;Intact 05/20/20 0000  Drainage Appearance Bloody 05/20/20 0000  Status To suction (Charged) 05/20/20 0000  Output (mL) 10 mL 05/20/20 0600     Urethral Catheter Lilli, EMT Temperature probe;Latex 16 Fr. (Active)  Indication for Insertion or Continuance of Catheter Unstable critically ill patients first 24-48 hours (See Criteria) 05/20/20 0000  Site Assessment Clean;Intact 05/20/20 0000  Catheter Maintenance Bag below level of bladder;Drainage bag/tubing not touching floor;Catheter secured;No dependent loops;Seal intact 05/20/20 0000  Collection Container Standard drainage bag 05/20/20 0000  Securement Method Securing device (Describe) 05/20/20 0000  Urinary Catheter Interventions (if applicable) Unclamped 32/99/24 0000  Output (mL) 900 mL 05/20/20 0600    Microbiology/Sepsis markers: Results for orders placed or performed during the hospital encounter of 05/19/20  Resp Panel by RT-PCR (Flu A&B, Covid) Nasopharyngeal Swab     Status: None   Collection Time: 05/19/20  7:29 PM   Specimen: Nasopharyngeal Swab; Nasopharyngeal(NP) swabs in vial transport medium  Result Value Ref Range Status   SARS Coronavirus 2 by RT PCR NEGATIVE NEGATIVE Final    Comment: (NOTE) SARS-CoV-2 target nucleic  acids are NOT DETECTED.  The SARS-CoV-2 RNA is generally detectable in upper respiratory specimens during the acute phase of infection. The lowest concentration of SARS-CoV-2 viral copies this assay can detect is 138 copies/mL. A negative result does not preclude SARS-Cov-2 infection and should not be used as the sole basis for treatment or other patient management decisions. A negative result may occur with  improper specimen collection/handling, submission of specimen other than nasopharyngeal swab, presence of viral mutation(s) within the areas targeted by this assay, and inadequate number of viral copies(<138 copies/mL). A negative result must be combined with clinical observations, patient history, and epidemiological information. The expected result is Negative.  Fact Sheet for Patients:  EntrepreneurPulse.com.au  Fact Sheet for Healthcare Providers:  IncredibleEmployment.be  This test is no t yet approved or cleared by the [place] FDA and  has been authorized for detection and/or diagnosis of SARS-CoV-2 by FDA under an Emergency Use Authorization (EUA). This EUA will remain  in effect (meaning this test can be used) for the duration of the COVID-19 declaration under Section 564(b)(1) of the Act, 21 U.S.C.section 360bbb-3(b)(1), unless the authorization is terminated  or revoked sooner.       Influenza A by PCR NEGATIVE NEGATIVE Final   Influenza B by PCR NEGATIVE NEGATIVE Final    Comment: (NOTE) The Xpert Xpress SARS-CoV-2/FLU/RSV plus assay is intended as an aid in the diagnosis of influenza from Nasopharyngeal swab specimens and should not be used as a sole basis for treatment. Nasal washings and aspirates are unacceptable for Xpert Xpress SARS-CoV-2/FLU/RSV testing.  Fact Sheet for Patients: EntrepreneurPulse.com.au  Fact Sheet for Healthcare  Providers: IncredibleEmployment.be  This  test is not yet approved or cleared by the [place] and has been authorized for detection and/or diagnosis of SARS-CoV-2 by FDA under an Emergency Use Authorization (EUA). This EUA will remain in effect (meaning this test can be used) for the duration of the COVID-19 declaration under Section 564(b)(1) of the Act, 21 U.S.C. section 360bbb-3(b)(1), unless the authorization is terminated or revoked.  Performed at [facility], [address] [place], [place] [address]   MRSA PCR Screening     Status: None   Collection Time: 05/20/20 12:02 AM   Specimen: Nasal Mucosa; Nasopharyngeal  Result Value Ref Range Status   MRSA by PCR NEGATIVE NEGATIVE Final    Comment:        The GeneXpert MRSA Assay (FDA approved for NASAL specimens only), is one component of a comprehensive MRSA colonization surveillance program. It is not intended to diagnose MRSA infection nor to guide or monitor treatment for MRSA infections. Performed at [facility], [place] [address], [place], [place] [address]     Anti-infectives:  Anti-infectives (From admission, onward)   Start     Dose/Rate Route Frequency Ordered Stop   05/19/20 2115  ceFAZolin (ANCEF) IVPB 2g/100 mL premix        2 g 200 mL/hr over 30 Minutes Intravenous  Once 05/19/20 2104 05/19/20 2207      Best Practice/Protocols:  VTE Prophylaxis: Mechanical Continous Sedation  Consults:     Studies:    Events:  Subjective:    Overnight Issues:   Objective:  Vital signs for last 24 hours: Temp:  [97.8 F (36.6 C)-99.3 F (37.4 C)] 99.3 F (37.4 C) (11/25 0600) Pulse Rate:  [32-87] 66 (11/25 0600) Resp:  [15-24] 16 (11/25 0600) BP: (83-199)/(54-104) 100/61 (11/25 0600) SpO2:  [98 %-100 %] 100 % (11/25 0600) FiO2 (%):  [40 %-100 %] 40 % (11/25 0347) Weight:  [59 kg] 59 kg (11/24 2200)  Hemodynamic parameters for last 24 hours:    Intake/Output from previous day: 11/24 0701 - 11/25 0700 In: -    Out: 910 [Urine:900; Drains:10]  Intake/Output this shift: No intake/output data recorded.  Vent settings for last 24 hours: Vent Mode: PRVC FiO2 (%):  [40 %-100 %] 40 % Set Rate:  [16 bmp] 16 bmp Vt Set:  [450 mL] 450 mL PEEP:  [5 cmH20] 5 cmH20 Plateau Pressure:  [15 cmH20] 15 cmH20  Physical Exam:  General: on vent Neuro: arouses and F/C HEENT/Neck: mild R neck hematoma, JP SS Resp: clear to auscultation bilaterally CVS: RRR GI: soft, NT Extremities: calves soft  Results for orders placed or performed during the hospital encounter of 05/19/20 (from the past 24 hour(s))  Basic metabolic panel     Status: Abnormal   Collection Time: 05/19/20  7:00 PM  Result Value Ref Range   Sodium 140 135 - 145 mmol/L   Potassium 4.1 3.5 - 5.1 mmol/L   Chloride 103 98 - 111 mmol/L   CO2 27 22 - 32 mmol/L   Glucose, Bld 101 (H) 70 - 99 mg/dL   BUN 20 8 - 23 mg/dL   Creatinine, Ser 0.66 0.44 - 1.00 mg/dL   Calcium 9.3 8.9 - 10.3 mg/dL   GFR, Estimated >60 >60 mL/min   Anion gap 10 5 - 15  Resp Panel by RT-PCR (Flu A&B, Covid) Nasopharyngeal Swab     Status: None   Collection Time: 05/19/20  7:29 PM  Specimen: Nasopharyngeal Swab; Nasopharyngeal(NP) swabs in vial transport medium  Result Value Ref Range   SARS Coronavirus 2 by RT PCR NEGATIVE NEGATIVE   Influenza A by PCR NEGATIVE NEGATIVE   Influenza B by PCR NEGATIVE NEGATIVE  Comprehensive metabolic panel     Status: Abnormal   Collection Time: 05/19/20  9:06 PM  Result Value Ref Range   Sodium 139 135 - 145 mmol/L   Potassium 3.4 (L) 3.5 - 5.1 mmol/L   Chloride 106 98 - 111 mmol/L   CO2 21 (L) 22 - 32 mmol/L   Glucose, Bld 116 (H) 70 - 99 mg/dL   BUN 17 8 - 23 mg/dL   Creatinine, Ser 0.67 0.44 - 1.00 mg/dL   Calcium 8.7 (L) 8.9 - 10.3 mg/dL   Total Protein 5.9 (L) 6.5 - 8.1 g/dL   Albumin 3.8 3.5 - 5.0 g/dL   AST 28 15 - 41 U/L   ALT 19 0 - 44 U/L   Alkaline Phosphatase 45 38 - 126 U/L   Total Bilirubin 0.9 0.3 - 1.2  mg/dL   GFR, Estimated >60 >60 mL/min   Anion gap 12 5 - 15  CBC     Status: Abnormal   Collection Time: 05/19/20  9:06 PM  Result Value Ref Range   WBC 13.0 (H) 4.0 - 10.5 K/uL   RBC 3.97 3.87 - 5.11 MIL/uL   Hemoglobin 12.5 12.0 - 15.0 g/dL   HCT 38.7 36 - 46 %   MCV 97.5 80.0 - 100.0 fL   MCH 31.5 26.0 - 34.0 pg   MCHC 32.3 30.0 - 36.0 g/dL   RDW 12.7 11.5 - 15.5 %   Platelets 200 150 - 400 K/uL   nRBC 0.0 0.0 - 0.2 %  Ethanol     Status: None   Collection Time: 05/19/20  9:06 PM  Result Value Ref Range   Alcohol, Ethyl (B) <10 <10 mg/dL  Lactic acid, plasma     Status: None   Collection Time: 05/19/20  9:06 PM  Result Value Ref Range   Lactic Acid, Venous 1.2 0.5 - 1.9 mmol/L  Protime-INR     Status: None   Collection Time: 05/19/20  9:06 PM  Result Value Ref Range   Prothrombin Time 12.9 11.4 - 15.2 seconds   INR 1.0 0.8 - 1.2  Sample to Blood Bank     Status: None   Collection Time: 05/19/20  9:06 PM  Result Value Ref Range   Blood Bank Specimen SAMPLE AVAILABLE FOR TESTING    Sample Expiration      05/20/2020,2359 Performed at [facility], [address], [place], [place] [address]   I-Stat Chem 8, ED     Status: Abnormal   Collection Time: 05/19/20  9:14 PM  Result Value Ref Range   Sodium 141 135 - 145 mmol/L   Potassium 3.3 (L) 3.5 - 5.1 mmol/L   Chloride 105 98 - 111 mmol/L   BUN 20 8 - 23 mg/dL   Creatinine, Ser 0.60 0.44 - 1.00 mg/dL   Glucose, Bld 111 (H) 70 - 99 mg/dL   Calcium, Ion 1.13 (L) 1.15 - 1.40 mmol/L   TCO2 22 22 - 32 mmol/L   Hemoglobin 13.3 12.0 - 15.0 g/dL   HCT 39.0 36 - 46 %  I-Stat arterial blood gas, ED     Status: Abnormal   Collection Time: 05/19/20 10:15 PM  Result Value Ref Range   pH, Arterial 7.381 7.35 -  7.45   pCO2 arterial 45.6 32 - 48 mmHg   pO2, Arterial 484 (H) 83 - 108 mmHg   Bicarbonate 27.2 20.0 - 28.0 mmol/L   TCO2 29 22 - 32 mmol/L   O2 Saturation 100.0 %   Acid-Base Excess 1.0 0.0 - 2.0 mmol/L    Sodium 139 135 - 145 mmol/L   Potassium 3.2 (L) 3.5 - 5.1 mmol/L   Calcium, Ion 1.19 1.15 - 1.40 mmol/L   HCT 35.0 (L) 36 - 46 %   Hemoglobin 11.9 (L) 12.0 - 15.0 g/dL   Patient temperature 97.8 F    Collection site Radial    Drawn by RT    Sample type ARTERIAL   Urinalysis, Routine w reflex microscopic Urine, Catheterized     Status: Abnormal   Collection Time: 05/20/20 12:02 AM  Result Value Ref Range   Color, Urine STRAW (A) YELLOW   APPearance CLEAR CLEAR   Specific Gravity, Urine >1.046 (H) 1.005 - 1.030   pH 6.0 5.0 - 8.0   Glucose, UA NEGATIVE NEGATIVE mg/dL   Hgb urine dipstick NEGATIVE NEGATIVE   Bilirubin Urine NEGATIVE NEGATIVE   Ketones, ur 5 (A) NEGATIVE mg/dL   Protein, ur NEGATIVE NEGATIVE mg/dL   Nitrite NEGATIVE NEGATIVE   Leukocytes,Ua NEGATIVE NEGATIVE  MRSA PCR Screening     Status: None   Collection Time: 05/20/20 12:02 AM   Specimen: Nasal Mucosa; Nasopharyngeal  Result Value Ref Range   MRSA by PCR NEGATIVE NEGATIVE  CBC     Status: Abnormal   Collection Time: 05/20/20  2:16 AM  Result Value Ref Range   WBC 10.2 4.0 - 10.5 K/uL   RBC 3.78 (L) 3.87 - 5.11 MIL/uL   Hemoglobin 12.3 12.0 - 15.0 g/dL   HCT 36.8 36 - 46 %   MCV 97.4 80.0 - 100.0 fL   MCH 32.5 26.0 - 34.0 pg   MCHC 33.4 30.0 - 36.0 g/dL   RDW 12.9 11.5 - 15.5 %   Platelets 196 150 - 400 K/uL   nRBC 0.0 0.0 - 0.2 %  Basic metabolic panel     Status: Abnormal   Collection Time: 05/20/20  2:16 AM  Result Value Ref Range   Sodium 138 135 - 145 mmol/L   Potassium 3.7 3.5 - 5.1 mmol/L   Chloride 103 98 - 111 mmol/L   CO2 25 22 - 32 mmol/L   Glucose, Bld 139 (H) 70 - 99 mg/dL   BUN 15 8 - 23 mg/dL   Creatinine, Ser 0.73 0.44 - 1.00 mg/dL   Calcium 8.7 (L) 8.9 - 10.3 mg/dL   GFR, Estimated >60 >60 mL/min   Anion gap 10 5 - 15  Triglycerides     Status: None   Collection Time: 05/20/20  2:16 AM  Result Value Ref Range   Triglycerides 46 <150 mg/dL    Assessment & Plan: Present on  Admission: . Laceration of head and neck    LOS: 1 day   Additional comments:I reviewed the patient's new clinical lab test results. and CXR Fall down step with blunt neck trauma S/P neck exploration with bleeding control 11/24 by Dr. Thermon Leyland - JP ss, hematoma is small Acute hypoxic ventilator dependent respiratory failure - neck hematoma is small, wean to extubate FEN - eval swallow after extubation VTE - PAS for now Dispo - ICU Critical Care Total Time*: 35 Minutes  Georganna Skeans, MD, MPH, FACS Trauma & General Surgery Use [email or web address] to contact on call  provider  05/20/2020  *Care during the described time interval was provided by me. I have reviewed this patient's available data, including medical history, events of note, physical examination and test results as part of my evaluation.

## 2020-05-20 NOTE — Progress Notes (Signed)
Patient ID: Margit Batte, female   DOB: 01/01/1946, 74 y.o.   MRN: [id number] Doing well after extubation. Voice clear. Swallowing without difficulty. Start clears. I spoke with her daughter earlier this AM.  Georganna Skeans, MD, MPH, FACS Please use [email or web address] to contact on call provider 05/20/2020 11:21 AM

## 2020-05-20 NOTE — Consult Note (Signed)
Responded to ED page to escort  pt's husband to [facility] waiting rm. Provided compassionate presence, active listening, as he talked about many things while waiting for surgeon to report on his wife's status. Found surgeon who then did so, explaining procedure, and that she would be asleep till morning, so there was no need for him then to go wait in [facility] waiting rm. Couple's daughter is driving over from [place] now. After hearing surgeon's positive report re: surgery, pt's husband decided to go home, wait for his daughter to arrive there, and come back in morning. That's what he felt his wife would want them to do. Family is Engineer, manufacturing [facility]).  Rev. Eloise Levels Chaplain

## 2020-05-21 ENCOUNTER — Encounter (HOSPITAL_COMMUNITY): Payer: Self-pay | Admitting: Surgery

## 2020-05-21 LAB — BASIC METABOLIC PANEL
Anion gap: 9 (ref 5–15)
BUN: 11 mg/dL (ref 8–23)
CO2: 24 mmol/L (ref 22–32)
Calcium: 8.3 mg/dL — ABNORMAL LOW (ref 8.9–10.3)
Chloride: 107 mmol/L (ref 98–111)
Creatinine, Ser: 0.58 mg/dL (ref 0.44–1.00)
GFR, Estimated: >60 mL/min (ref >60)
Glucose, Bld: 105 mg/dL — ABNORMAL HIGH (ref 70–99)
Potassium: 3.4 mmol/L — ABNORMAL LOW (ref 3.5–5.1)
Sodium: 140 mmol/L (ref 135–145)

## 2020-05-21 LAB — CBC
HCT: 30.9 % — ABNORMAL LOW (ref 36.0–46.0)
Hemoglobin: 10.3 g/dL — ABNORMAL LOW (ref 12.0–15.0)
MCH: 32.2 pg (ref 26.0–34.0)
MCHC: 33.3 g/dL (ref 30.0–36.0)
MCV: 96.6 fL (ref 80.0–100.0)
Platelets: 174 10*3/uL (ref 150–400)
RBC: 3.2 MIL/uL — ABNORMAL LOW (ref 3.87–5.11)
RDW: 13.2 % (ref 11.5–15.5)
WBC: 8.3 10*3/uL (ref 4.0–10.5)
nRBC: 0 % (ref 0.0–0.2)

## 2020-05-21 MED ORDER — OXYCODONE HCL 5 MG PO TABS: 5.0000 mg | ORAL_TABLET | Freq: Four times a day (QID) | ORAL | Status: DC | PRN

## 2020-05-21 MED ORDER — POLYETHYLENE GLYCOL 3350 17 G PO PACK
17.0000 g | PACK | Freq: Every day | ORAL | Status: DC
  Administered 2020-05-21: 17 g via ORAL
  Filled 2020-05-21: qty 1

## 2020-05-21 MED ORDER — OXYCODONE HCL 5 MG PO TABS: 5.0000 mg | ORAL_TABLET | Freq: Four times a day (QID) | ORAL | 0 refills | Status: DC | PRN

## 2020-05-21 NOTE — Evaluation (Signed)
Physical Therapy Evaluation Patient Details Name: Dana Caldwell MRN: [id number] DOB: 14-Jun-1946 Today's Date: 05/21/2020   History of Present Illness  74 yo female admitted to ED on 11/24 with fall and chin laceration. ETT 11/24-11/25 for airway protection, s/p neck would exploration, cautery of bleeding vessel, and subcutaneous drain placement on 11/24. PMH includes anxiety, OA, HLD, migraines, osteopenia.  Clinical Impression   Pt presents with Dana Caldwell, balance, and activity tolerance post-fall, pt's main complaint is neck and chin soreness. Pt ambulated great hallway distance, proficiently navigated a flight of steps with supervision, and has no further questions about mobility s/p fall and chin laceration. PT educated pt on icing when neck is swollen, monitor for difficulty breathing at home and return to ED if difficulty breathing occurs, and slow reintroduction into ADLs with family assist. PT to sign off, moving at baseline and will have husband/daughters to assist on d/c.    Follow Up Recommendations No PT follow up;Supervision for mobility/OOB    Equipment Recommendations  None recommended by PT    Recommendations for Other Services       Precautions / Restrictions Precautions Precautions: Fall Restrictions Weight Bearing Restrictions: No      Mobility  Bed Mobility               General bed mobility comments: up in chair upon PT arrival to room    Transfers Overall transfer level: Modified independent               General transfer comment: mod I for use of UEs to rise, no physical assist  Ambulation/Gait Ambulation/Gait assistance: Supervision Gait Distance (Feet): 500 Feet Assistive device: None Gait Pattern/deviations: Step-through pattern;WFL(Within Functional Limits) Gait velocity: WFL   General Gait Details: WFL gait, initially slow and cautious but progressing to age-appropriate gait speed with no evidence of  unsteadiness.  Stairs Stairs: Yes Stairs assistance: Supervision Stair Management: One rail Right;Alternating pattern;Forwards Number of Stairs: 12 General stair comments: supervision for safety, verbal cuing for step-to pattern as needed but pt performing step navigation well with step-over-step.  Wheelchair Mobility    Modified Rankin (Stroke Patients Only)       Balance Overall balance assessment: History of Falls;Mild deficits observed, not formally tested                                           Pertinent Vitals/Pain Pain Assessment: Faces Faces Pain Scale: Hurts a little bit Pain Location: chin, R side of face, throat Pain Descriptors / Indicators: Sore Pain Intervention(s): Limited activity within patient's tolerance;Monitored during session;Premedicated before session    Home Living Family/patient expects to be discharged to:: Private residence Living Arrangements: Spouse/significant other Available Help at Discharge: Family;Available 24 hours/day Type of Home: House Home Access: Stairs to enter   [employer] of Steps: 2 Home Layout: Two level Home Equipment: Walker - 2 wheels      Prior Function Level of Independence: Independent         Comments: pt reports being very active, walking daily. No other history of falls.     Hand Dominance   Dominant Hand: Right    Extremity/Trunk Assessment   Upper Extremity Assessment Upper Extremity Assessment: Overall WFL for tasks assessed    Lower Extremity Assessment Lower Extremity Assessment: Overall WFL for tasks assessed    Cervical / Trunk Assessment Cervical / Trunk Assessment:  Normal  Communication   Communication: No difficulties  Cognition Arousal/Alertness: Awake/alert Behavior During Therapy: WFL for tasks assessed/performed Overall Cognitive Status: Within Functional Limits for tasks assessed                                        General  Comments General comments (skin integrity, edema, etc.): VSS    Exercises Other Exercises Other Exercises: PT encouraged slow reintroduction into daily activities with supervision from family (walking in home and on flat surfaces like around her block), icing affected area   Assessment/Plan    PT Assessment Patent does not need any further PT services  PT Problem List         PT Treatment Interventions      PT Goals (Current goals can be found in the Care Plan section)  Acute Rehab PT Goals Patient Stated Goal: go home PT Goal Formulation: With patient Time For Goal Achievement: 05/21/20 Potential to Achieve Goals: Good    Frequency     Barriers to discharge        Co-evaluation               AM-PAC PT "6 Clicks" Mobility  Outcome Measure Help needed turning from your back to your side while in a flat bed without using bedrails?: None Help needed moving from lying on your back to sitting on the side of a flat bed without using bedrails?: None Help needed moving to and from a bed to a chair (including a wheelchair)?: None Help needed standing up from a chair using your arms (Caldwell.g., wheelchair or bedside chair)?: None Help needed to walk in hospital room?: A Little Help needed climbing 3-5 steps with a railing? : A Little 6 Click Score: 22    End of Session   Activity Tolerance: Patient tolerated treatment well;Patient limited by fatigue Patient left: with call bell/phone within reach;in chair;with family/visitor present Nurse Communication: Mobility status PT Visit Diagnosis: Other abnormalities of gait and mobility (R26.89)    Time: 6837-2902 PT Time Calculation (min) (ACUTE ONLY): 30 min   Charges:   PT Evaluation $PT Eval Low Complexity: 1 Low PT Treatments $Gait Training: 8-22 mins      Dana Caldwell, PT Acute Rehabilitation Services Pager [phone]  Office [phone]    Dana Caldwell 05/21/2020, 11:00 AM

## 2020-05-21 NOTE — Progress Notes (Signed)
OT Cancellation Note  Patient Details Name: Dana Caldwell MRN: [id number] DOB: 10/15/1945   Cancelled Treatment:    Reason Eval/Treat Not Completed: OT screened, no needs identified, will sign off.    Nilsa Nutting., OTR/L Acute Rehabilitation Services Pager [phone] Office [phone]   Lucille Passy M 05/21/2020, 2:59 PM

## 2020-05-21 NOTE — Discharge Summary (Signed)
Physician Discharge Summary  Patient ID: Vira Chaplin MRN: [id number] DOB/AGE: [birth year] 74 y.o.  Admit date: 05/19/2020 Discharge date: 05/21/2020  Admission Diagnoses:  Discharge Diagnoses:  Active Problems:   Laceration of head and neck   Discharged Condition: good  Hospital Course: She presented as ground level fall with hematoma of the neck that underwent exploration. Post op she was left intubated and admitted to the ICU. She was able to be extubated and tolerated diet without hoarseness or other neck issues. She was discharged home.  Consults: None  Significant Diagnostic Studies:  CBC    Component Value Date/Time   WBC 8.3 05/21/2020 0037   RBC 3.20 (L) 05/21/2020 0037   HGB 10.3 (L) 05/21/2020 0037   HCT 30.9 (L) 05/21/2020 0037   PLT 174 05/21/2020 0037   MCV 96.6 05/21/2020 0037   MCH 32.2 05/21/2020 0037   MCHC 33.3 05/21/2020 0037   RDW 13.2 05/21/2020 0037   LYMPHSABS 1.9 09/17/2013 0745   MONOABS 0.3 09/17/2013 0745   EOSABS 0.2 09/17/2013 0745   BASOSABS 0.0 09/17/2013 0745     Treatments: surgery for neck exploration  Discharge Exam: Blood pressure 124/63, pulse 80, temperature 98 F (36.7 C), temperature source Oral, resp. rate 20, height 5\' 5"  (1.651 m), weight 59 kg, SpO2 97 %. General appearance: alert and cooperative Head: Normocephalic, without obvious abnormality, atraumatic Neck: no adenopathy, no carotid bruit, no JVD, supple, symmetrical, trachea midline, thyroid not enlarged, symmetric, no tenderness/mass/nodules and superior neck incision intact Resp: clear to auscultation bilaterally Cardio: regular rate and rhythm, S1, S2 normal, no murmur, click, rub or gallop  Disposition: Discharge disposition: 01-Home or Self Care       Discharge Instructions    Diet - low sodium heart healthy   Complete by: As directed    Increase activity slowly   Complete by: As directed      Allergies as of 05/21/2020      Reactions    Penicillins    Hives while on PCN AND Sulfa   Sulfonamide Derivatives    Hives while on PCN AND Sulfa      Medication List    STOP taking these medications   buPROPion 150 MG 24 hr tablet Commonly known as: WELLBUTRIN XL   niacin 500 MG CR tablet Commonly known as: NIASPAN     TAKE these medications   ALPRAZolam 0.25 MG tablet Commonly known as: XANAX Takes 0/5 tablet as needed What changed:   how much to take  how to take this  when to take this  reasons to take this  additional instructions   baclofen 10 MG tablet Commonly known as: LIORESAL Take 10 mg by mouth as needed for muscle spasms.   BOTOX IJ Inject as directed. Botox injection every 3 months.   cholecalciferol 1000 units tablet Commonly known as: VITAMIN D Take 1,000 Units by mouth daily.   diclofenac 50 MG EC tablet Commonly known as: VOLTAREN Take 50 mg by mouth as needed for moderate pain.   ezetimibe-simvastatin 10-40 MG tablet Commonly known as: Vytorin take 1 tablet by mouth once daily What changed:   how much to take  how to take this  when to take this  additional instructions   Fish Oil 1000 MG Caps Take 1,200 mg by mouth daily.   fluticasone 50 MCG/ACT nasal spray Commonly known as: FLONASE Place 2 sprays into both nostrils as needed for allergies.   melatonin 5 MG Tabs Take 5 mg by  mouth at bedtime as needed (sleep).   oxyCODONE 5 MG immediate release tablet Commonly known as: Oxy IR/ROXICODONE Take 1 tablet (5 mg total) by mouth every 6 (six) hours as needed for moderate pain.   TURMERIC PO Take 1 tablet by mouth 2 (two) times daily.   zolpidem 5 MG tablet Commonly known as: AMBIEN Take 5 mg by mouth at bedtime as needed for sleep.       Follow-up Information    [facility] Follow up.   Why: Our office is working to schedule a follow up appointment in 2-3 weeks. Please call to confirm appointment date/time. Please arrive 30 min prior to appointment  time.  Contact information: [address] [phone] [phone]              Signed: Arta Bruce Bernardina Cacho 05/21/2020, 4:06 PM

## 2020-05-21 NOTE — Progress Notes (Signed)
Patient ID: Dana Caldwell, female   DOB: 05/16/1946, 74 y.o.   MRN: [id number] Trauma Service Note  Chief Complaint/Subjective: Tolerating liquids, throat soare but better with liquids  Objective: Vital signs in last 24 hours: Temp:  [97.6 F (36.4 C)-100.2 F (37.9 C)] 98 F (36.7 C) (11/26 0400) Pulse Rate:  [68-91] 84 (11/26 0800) Resp:  [12-20] 14 (11/26 0800) BP: (88-135)/(54-89) 108/59 (11/26 0400) SpO2:  [95 %-100 %] 98 % (11/26 0800) Last BM Date:  (PTA)  Intake/Output from previous day: 11/25 0701 - 11/26 0700 In: 763.8 [P.O.:600; I.V.:163.8] Out: 430 [Urine:400; Drains:30] Intake/Output this shift: Total I/O In: 700 [I.V.:700] Out: -   General: NAD  Lungs: nonlabored  Abd: nt, nd  Extremities: no edema  Neuro: AOx4  Neck: ecchymosis, incision intact no erythema, drain with serosanguinous output  Lab Results: CBC  Recent Labs    05/20/20 0216 05/21/20 0037  WBC 10.2 8.3  HGB 12.3 10.3*  HCT 36.8 30.9*  PLT 196 174   BMET Recent Labs    05/20/20 0216 05/21/20 0037  NA 138 140  K 3.7 3.4*  CL 103 107  CO2 25 24  GLUCOSE 139* 105*  BUN 15 11  CREATININE 0.73 0.58  CALCIUM 8.7* 8.3*   PT/INR Recent Labs    05/19/20 2106  LABPROT 12.9  INR 1.0   ABG Recent Labs    05/19/20 2215  PHART 7.381  HCO3 27.2    Studies/Results: No results found.  Anti-infectives: Anti-infectives (From admission, onward)   Start     Dose/Rate Route Frequency Ordered Stop   05/19/20 2115  ceFAZolin (ANCEF) IVPB 2g/100 mL premix        2 g 200 mL/hr over 30 Minutes Intravenous  Once 05/19/20 2104 05/19/20 2207      Medications Scheduled Meds: . Chlorhexidine Gluconate Cloth  6 each Topical Daily  . docusate sodium  100 mg Oral BID  . polyethylene glycol  17 g Per Tube Daily   Continuous Infusions: PRN Meds:.acetaminophen, ALPRAZolam, fentaNYL (SUBLIMAZE) injection, fentaNYL (SUBLIMAZE) injection, ondansetron **OR** ondansetron (ZOFRAN) IV,  oxyCODONE, prochlorperazine **OR** prochlorperazine  Assessment/Plan: s/p Procedure(s): RIGHT NECK EXPLORATION -advance diet -transfer to floor -remove drain -PT/OT -home today vs tomorrow   LOS: 2 days   [place] Surgeon [phone] Surgery 05/21/2020

## 2020-05-21 NOTE — Progress Notes (Signed)
Patient's vital signs stable.  Patient is alert and oriented.  After visit summary reviewed and patient's question answered.  All belongings return to patient.  Patient discharge via wheelchair with patient's daughter.

## 2020-05-21 NOTE — TOC CAGE-AID Note (Signed)
Transition of Care [facility]) - CAGE-AID Screening   Patient Details  Name: Dana Caldwell MRN: [id number] Date of Birth: 1945-07-23  Transition of Care [facility]) CM/SW Contact:    Emeterio Reeve, [place] Phone Number: 05/21/2020, 3:22 PM   Clinical Narrative:  CSW met with pt at bedside. CSW introduced self and explained role at the hospital.  Pt denies alcohol use. Pt denies substance use. Pt did not need any resources at this time.    CAGE-AID Screening:    Have You Ever Felt You Ought to Cut Down on Your Drinking or Drug Use?: No Have People Annoyed You By Critizing Your Drinking Or Drug Use?: No Have You Felt Bad Or Guilty About Your Drinking Or Drug Use?: No Have You Ever Had a Drink or Used Drugs First Thing In The Morning to Steady Your Nerves or to Get Rid of a Hangover?: No CAGE-AID Score: 0  Substance Abuse Education Offered: Yes     Blima Ledger, [place] Social Worker [phone]

## 2020-05-24 ENCOUNTER — Encounter: Payer: Self-pay | Admitting: Podiatry

## 2020-05-24 ENCOUNTER — Ambulatory Visit (INDEPENDENT_AMBULATORY_CARE_PROVIDER_SITE_OTHER): Payer: Medicare PPO

## 2020-05-24 ENCOUNTER — Other Ambulatory Visit: Payer: Self-pay

## 2020-05-24 ENCOUNTER — Ambulatory Visit: Payer: Medicare PPO | Admitting: Podiatry

## 2020-05-24 DIAGNOSIS — M79672 Pain in left foot: Secondary | ICD-10-CM

## 2020-05-24 DIAGNOSIS — M722 Plantar fascial fibromatosis: Secondary | ICD-10-CM

## 2020-05-24 DIAGNOSIS — M2042 Other hammer toe(s) (acquired), left foot: Secondary | ICD-10-CM | POA: Diagnosis not present

## 2020-05-24 MED ORDER — TRIAMCINOLONE ACETONIDE 10 MG/ML IJ SUSP
10.0000 mg | Freq: Once | INTRAMUSCULAR | Status: AC
  Administered 2020-05-24: 10 mg

## 2020-05-24 NOTE — Patient Instructions (Signed)

## 2020-05-26 NOTE — Progress Notes (Signed)
Subjective:   Patient ID: Dana Caldwell, female   DOB: 74 y.o.   MRN: [id number]   HPI Patient presents stating she has a lot of pain in her left arch that has been sore and also pain on top of her foot.  Patient states that the arch has become worse and the pain on the top started and seems to be relatively moderated.  Patient does not smoke likes to be active and states is been going on for several months   Review of Systems  All other systems reviewed and are negative.       Objective:  Physical Exam Vitals and nursing note reviewed.  Constitutional:      Appearance: She is well-developed.  Pulmonary:     Effort: Pulmonary effort is normal.  Musculoskeletal:        General: Normal range of motion.  Skin:    General: Skin is warm.  Neurological:     Mental Status: She is alert.     Neurovascular status found to be intact muscle strength found to be adequate range of motion within normal limits.  Patient is noted to have exquisite discomfort in the left arch with inflammation fluid buildup and is noted to have discomfort of a moderate nature dorsal lateral foot around the peroneal complex.  Patient has good digital perfusion well oriented x3 moderate depression of the arch is noted     Assessment:  Inflammatory fasciitis left which may be compensatory or primary with peroneal tendinitis moderate and left     Plan:  H&P reviewed all conditions.  At this point we will get a focus on the arch due to the pain in this area and I did do sterile prep and injected the medial fascial band in the mid arch area 3 mg Kenalog 5 mg Xylocaine applied fascial brace to lift up the arch gave instructions for supportive shoes stretching and ice therapy and will reappoint again in the next 3 to 4 weeks or earlier if needed  X-rays indicate that there is no signs of stress fracture arthritis or bone pathology associated with this

## 2020-06-01 ENCOUNTER — Other Ambulatory Visit: Payer: Self-pay | Admitting: Podiatry

## 2020-06-01 DIAGNOSIS — M722 Plantar fascial fibromatosis: Secondary | ICD-10-CM

## 2020-08-05 DIAGNOSIS — G518 Other disorders of facial nerve: Secondary | ICD-10-CM | POA: Diagnosis not present

## 2020-08-05 DIAGNOSIS — G43719 Chronic migraine without aura, intractable, without status migrainosus: Secondary | ICD-10-CM | POA: Diagnosis not present

## 2020-08-05 DIAGNOSIS — M542 Cervicalgia: Secondary | ICD-10-CM | POA: Diagnosis not present

## 2020-09-24 DIAGNOSIS — Z1231 Encounter for screening mammogram for malignant neoplasm of breast: Secondary | ICD-10-CM | POA: Diagnosis not present

## 2020-09-29 DIAGNOSIS — L812 Freckles: Secondary | ICD-10-CM | POA: Diagnosis not present

## 2020-09-29 DIAGNOSIS — L570 Actinic keratosis: Secondary | ICD-10-CM | POA: Diagnosis not present

## 2020-09-29 DIAGNOSIS — D1801 Hemangioma of skin and subcutaneous tissue: Secondary | ICD-10-CM | POA: Diagnosis not present

## 2020-09-29 DIAGNOSIS — D2262 Melanocytic nevi of left upper limb, including shoulder: Secondary | ICD-10-CM | POA: Diagnosis not present

## 2020-09-29 DIAGNOSIS — L814 Other melanin hyperpigmentation: Secondary | ICD-10-CM | POA: Diagnosis not present

## 2020-09-29 DIAGNOSIS — L821 Other seborrheic keratosis: Secondary | ICD-10-CM | POA: Diagnosis not present

## 2020-09-29 DIAGNOSIS — D2261 Melanocytic nevi of right upper limb, including shoulder: Secondary | ICD-10-CM | POA: Diagnosis not present

## 2020-10-01 DIAGNOSIS — H5203 Hypermetropia, bilateral: Secondary | ICD-10-CM | POA: Diagnosis not present

## 2020-10-01 DIAGNOSIS — D23112 Other benign neoplasm of skin of right lower eyelid, including canthus: Secondary | ICD-10-CM | POA: Diagnosis not present

## 2020-10-25 DIAGNOSIS — D23112 Other benign neoplasm of skin of right lower eyelid, including canthus: Secondary | ICD-10-CM | POA: Diagnosis not present

## 2020-11-01 DIAGNOSIS — G43719 Chronic migraine without aura, intractable, without status migrainosus: Secondary | ICD-10-CM | POA: Diagnosis not present

## 2020-11-01 DIAGNOSIS — M791 Myalgia, unspecified site: Secondary | ICD-10-CM | POA: Diagnosis not present

## 2020-11-01 DIAGNOSIS — G518 Other disorders of facial nerve: Secondary | ICD-10-CM | POA: Diagnosis not present

## 2020-11-01 DIAGNOSIS — M542 Cervicalgia: Secondary | ICD-10-CM | POA: Diagnosis not present

## 2021-02-09 DIAGNOSIS — F320 Major depressive disorder, single episode, mild: Secondary | ICD-10-CM | POA: Diagnosis not present

## 2021-02-09 DIAGNOSIS — F419 Anxiety disorder, unspecified: Secondary | ICD-10-CM | POA: Diagnosis not present

## 2021-02-09 DIAGNOSIS — M542 Cervicalgia: Secondary | ICD-10-CM | POA: Diagnosis not present

## 2021-02-14 DIAGNOSIS — E559 Vitamin D deficiency, unspecified: Secondary | ICD-10-CM | POA: Diagnosis not present

## 2021-02-14 DIAGNOSIS — Z0000 Encounter for general adult medical examination without abnormal findings: Secondary | ICD-10-CM | POA: Diagnosis not present

## 2021-02-14 DIAGNOSIS — Z79899 Other long term (current) drug therapy: Secondary | ICD-10-CM | POA: Diagnosis not present

## 2021-02-17 DIAGNOSIS — G43719 Chronic migraine without aura, intractable, without status migrainosus: Secondary | ICD-10-CM | POA: Diagnosis not present

## 2021-02-17 DIAGNOSIS — G518 Other disorders of facial nerve: Secondary | ICD-10-CM | POA: Diagnosis not present

## 2021-02-17 DIAGNOSIS — M791 Myalgia, unspecified site: Secondary | ICD-10-CM | POA: Diagnosis not present

## 2021-02-17 DIAGNOSIS — M542 Cervicalgia: Secondary | ICD-10-CM | POA: Diagnosis not present

## 2021-02-21 DIAGNOSIS — E559 Vitamin D deficiency, unspecified: Secondary | ICD-10-CM | POA: Diagnosis not present

## 2021-02-21 DIAGNOSIS — Z1331 Encounter for screening for depression: Secondary | ICD-10-CM | POA: Diagnosis not present

## 2021-02-21 DIAGNOSIS — M8588 Other specified disorders of bone density and structure, other site: Secondary | ICD-10-CM | POA: Diagnosis not present

## 2021-02-21 DIAGNOSIS — Z79899 Other long term (current) drug therapy: Secondary | ICD-10-CM | POA: Diagnosis not present

## 2021-02-21 DIAGNOSIS — G2581 Restless legs syndrome: Secondary | ICD-10-CM | POA: Diagnosis not present

## 2021-02-21 DIAGNOSIS — Z1339 Encounter for screening examination for other mental health and behavioral disorders: Secondary | ICD-10-CM | POA: Diagnosis not present

## 2021-02-21 DIAGNOSIS — G4700 Insomnia, unspecified: Secondary | ICD-10-CM | POA: Diagnosis not present

## 2021-02-21 DIAGNOSIS — R82998 Other abnormal findings in urine: Secondary | ICD-10-CM | POA: Diagnosis not present

## 2021-02-21 DIAGNOSIS — Z0000 Encounter for general adult medical examination without abnormal findings: Secondary | ICD-10-CM | POA: Diagnosis not present

## 2021-02-21 DIAGNOSIS — F419 Anxiety disorder, unspecified: Secondary | ICD-10-CM | POA: Diagnosis not present

## 2021-04-01 DIAGNOSIS — G43909 Migraine, unspecified, not intractable, without status migrainosus: Secondary | ICD-10-CM | POA: Diagnosis not present

## 2021-04-01 DIAGNOSIS — G4700 Insomnia, unspecified: Secondary | ICD-10-CM | POA: Diagnosis not present

## 2021-04-01 DIAGNOSIS — F419 Anxiety disorder, unspecified: Secondary | ICD-10-CM | POA: Diagnosis not present

## 2021-04-01 DIAGNOSIS — E785 Hyperlipidemia, unspecified: Secondary | ICD-10-CM | POA: Diagnosis not present

## 2021-04-01 DIAGNOSIS — L309 Dermatitis, unspecified: Secondary | ICD-10-CM | POA: Diagnosis not present

## 2021-04-01 DIAGNOSIS — R030 Elevated blood-pressure reading, without diagnosis of hypertension: Secondary | ICD-10-CM | POA: Diagnosis not present

## 2021-04-01 DIAGNOSIS — G8929 Other chronic pain: Secondary | ICD-10-CM | POA: Diagnosis not present

## 2021-04-01 DIAGNOSIS — G4730 Sleep apnea, unspecified: Secondary | ICD-10-CM | POA: Diagnosis not present

## 2021-04-01 DIAGNOSIS — M1990 Unspecified osteoarthritis, unspecified site: Secondary | ICD-10-CM | POA: Diagnosis not present

## 2021-05-03 DIAGNOSIS — Z1212 Encounter for screening for malignant neoplasm of rectum: Secondary | ICD-10-CM | POA: Diagnosis not present

## 2021-06-08 DIAGNOSIS — G43719 Chronic migraine without aura, intractable, without status migrainosus: Secondary | ICD-10-CM | POA: Diagnosis not present

## 2021-06-08 DIAGNOSIS — M542 Cervicalgia: Secondary | ICD-10-CM | POA: Diagnosis not present

## 2021-06-08 DIAGNOSIS — G518 Other disorders of facial nerve: Secondary | ICD-10-CM | POA: Diagnosis not present

## 2021-06-08 DIAGNOSIS — M791 Myalgia, unspecified site: Secondary | ICD-10-CM | POA: Diagnosis not present

## 2021-08-25 DIAGNOSIS — D2272 Melanocytic nevi of left lower limb, including hip: Secondary | ICD-10-CM | POA: Diagnosis not present

## 2021-08-25 DIAGNOSIS — L570 Actinic keratosis: Secondary | ICD-10-CM | POA: Diagnosis not present

## 2021-08-25 DIAGNOSIS — L821 Other seborrheic keratosis: Secondary | ICD-10-CM | POA: Diagnosis not present

## 2021-08-25 DIAGNOSIS — D0471 Carcinoma in situ of skin of right lower limb, including hip: Secondary | ICD-10-CM | POA: Diagnosis not present

## 2021-08-25 DIAGNOSIS — L814 Other melanin hyperpigmentation: Secondary | ICD-10-CM | POA: Diagnosis not present

## 2021-08-25 DIAGNOSIS — L603 Nail dystrophy: Secondary | ICD-10-CM | POA: Diagnosis not present

## 2021-08-25 DIAGNOSIS — L720 Epidermal cyst: Secondary | ICD-10-CM | POA: Diagnosis not present

## 2021-08-25 DIAGNOSIS — D235 Other benign neoplasm of skin of trunk: Secondary | ICD-10-CM | POA: Diagnosis not present

## 2021-08-25 DIAGNOSIS — D485 Neoplasm of uncertain behavior of skin: Secondary | ICD-10-CM | POA: Diagnosis not present

## 2021-09-07 DIAGNOSIS — M791 Myalgia, unspecified site: Secondary | ICD-10-CM | POA: Diagnosis not present

## 2021-09-07 DIAGNOSIS — G518 Other disorders of facial nerve: Secondary | ICD-10-CM | POA: Diagnosis not present

## 2021-09-07 DIAGNOSIS — M542 Cervicalgia: Secondary | ICD-10-CM | POA: Diagnosis not present

## 2021-09-07 DIAGNOSIS — G43719 Chronic migraine without aura, intractable, without status migrainosus: Secondary | ICD-10-CM | POA: Diagnosis not present

## 2021-10-13 DIAGNOSIS — Z1231 Encounter for screening mammogram for malignant neoplasm of breast: Secondary | ICD-10-CM | POA: Diagnosis not present

## 2021-11-07 DIAGNOSIS — Z961 Presence of intraocular lens: Secondary | ICD-10-CM | POA: Diagnosis not present

## 2021-11-07 DIAGNOSIS — H5203 Hypermetropia, bilateral: Secondary | ICD-10-CM | POA: Diagnosis not present

## 2021-12-21 DIAGNOSIS — M542 Cervicalgia: Secondary | ICD-10-CM | POA: Diagnosis not present

## 2021-12-21 DIAGNOSIS — G43719 Chronic migraine without aura, intractable, without status migrainosus: Secondary | ICD-10-CM | POA: Diagnosis not present

## 2021-12-21 DIAGNOSIS — M791 Myalgia, unspecified site: Secondary | ICD-10-CM | POA: Diagnosis not present

## 2021-12-21 DIAGNOSIS — G518 Other disorders of facial nerve: Secondary | ICD-10-CM | POA: Diagnosis not present

## 2022-01-02 DIAGNOSIS — L649 Androgenic alopecia, unspecified: Secondary | ICD-10-CM | POA: Diagnosis not present

## 2022-01-26 DIAGNOSIS — M1990 Unspecified osteoarthritis, unspecified site: Secondary | ICD-10-CM | POA: Diagnosis not present

## 2022-01-26 DIAGNOSIS — G43909 Migraine, unspecified, not intractable, without status migrainosus: Secondary | ICD-10-CM | POA: Diagnosis not present

## 2022-01-26 DIAGNOSIS — I951 Orthostatic hypotension: Secondary | ICD-10-CM | POA: Diagnosis not present

## 2022-01-26 DIAGNOSIS — E785 Hyperlipidemia, unspecified: Secondary | ICD-10-CM | POA: Diagnosis not present

## 2022-01-26 DIAGNOSIS — R030 Elevated blood-pressure reading, without diagnosis of hypertension: Secondary | ICD-10-CM | POA: Diagnosis not present

## 2022-01-26 DIAGNOSIS — F419 Anxiety disorder, unspecified: Secondary | ICD-10-CM | POA: Diagnosis not present

## 2022-01-26 DIAGNOSIS — F325 Major depressive disorder, single episode, in full remission: Secondary | ICD-10-CM | POA: Diagnosis not present

## 2022-01-26 DIAGNOSIS — G4700 Insomnia, unspecified: Secondary | ICD-10-CM | POA: Diagnosis not present

## 2022-01-26 DIAGNOSIS — Z809 Family history of malignant neoplasm, unspecified: Secondary | ICD-10-CM | POA: Diagnosis not present

## 2022-01-31 ENCOUNTER — Encounter: Payer: Self-pay | Admitting: Gastroenterology

## 2022-02-22 DIAGNOSIS — E785 Hyperlipidemia, unspecified: Secondary | ICD-10-CM | POA: Diagnosis not present

## 2022-02-22 DIAGNOSIS — Z79899 Other long term (current) drug therapy: Secondary | ICD-10-CM | POA: Diagnosis not present

## 2022-02-22 DIAGNOSIS — F419 Anxiety disorder, unspecified: Secondary | ICD-10-CM | POA: Diagnosis not present

## 2022-02-22 DIAGNOSIS — Z0000 Encounter for general adult medical examination without abnormal findings: Secondary | ICD-10-CM | POA: Diagnosis not present

## 2022-02-22 DIAGNOSIS — E559 Vitamin D deficiency, unspecified: Secondary | ICD-10-CM | POA: Diagnosis not present

## 2022-02-22 DIAGNOSIS — R7989 Other specified abnormal findings of blood chemistry: Secondary | ICD-10-CM | POA: Diagnosis not present

## 2022-02-27 IMAGING — CT CT MAXILLOFACIAL W/O CM
3 of 4 series · 12 of 47 positions shown, 14 images · non-contrast
Comparison: None.

CLINICAL DATA: Fall, facial trauma with enlarging hematoma in the
neck.

EXAM:
CT HEAD WITHOUT CONTRAST
CT MAXILLOFACIAL WITHOUT CONTRAST
CT CERVICAL SPINE WITHOUT CONTRAST
TECHNIQUE: Multidetector CT imaging of the head, cervical spine, and
maxillofacial structures were performed using the standard protocol
without intravenous contrast. Multiplanar CT image reconstructions
of the cervical spine and maxillofacial structures were also
generated.

[Series 3: facial/ orbits 2.0 h30s · axial · 0.38mm/px · z∈[-165,-25]mm · 6 of 90 slices shown, 8 images]
[im 10/90  brain]
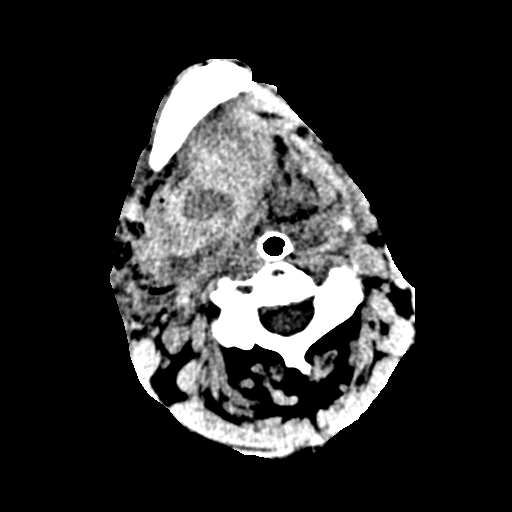
[im 10/90  bone]
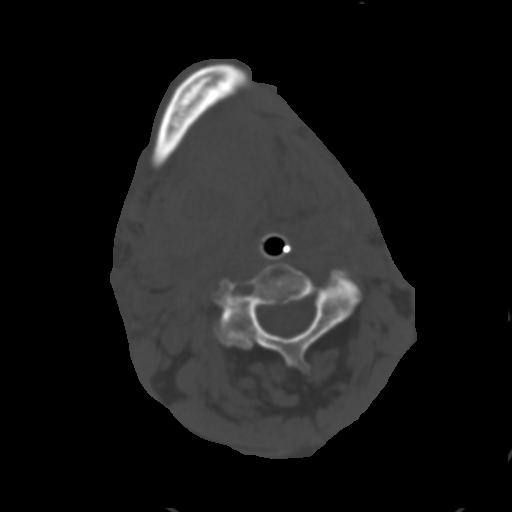
[im 30/90  bone]
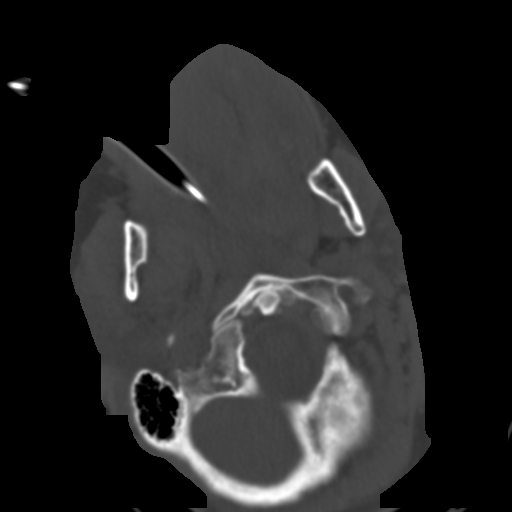
[im 40/90  bone]
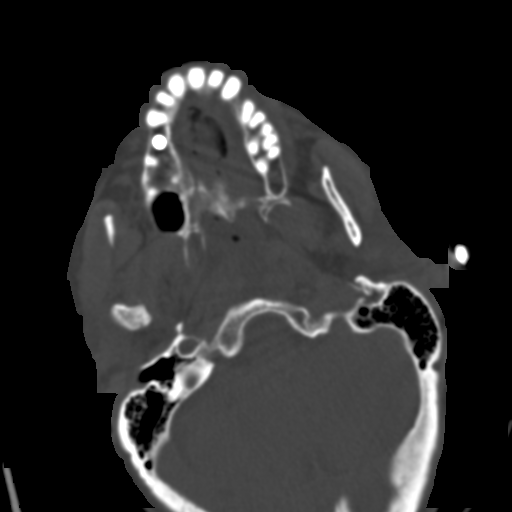
[im 50/90  bone]
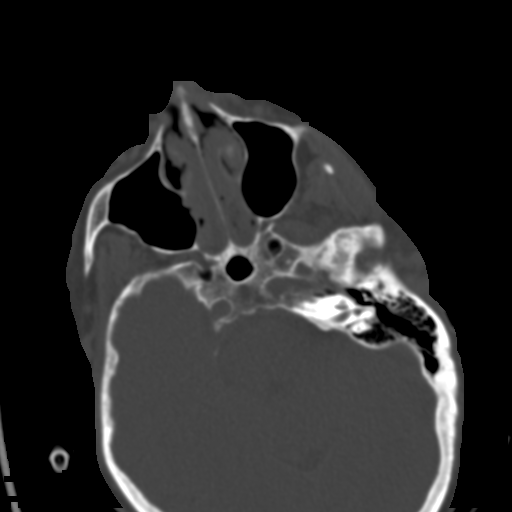
[im 70/90  brain]
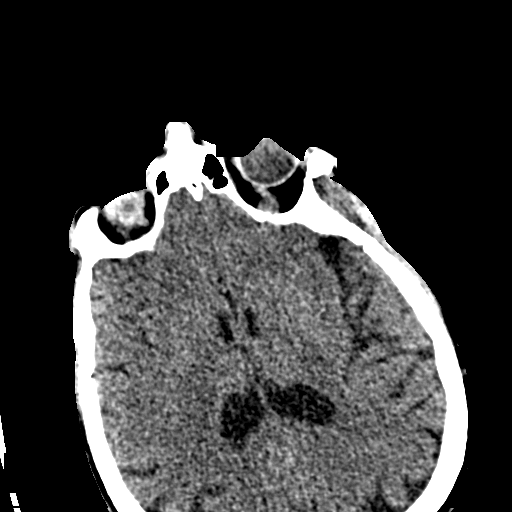
[im 70/90  bone]
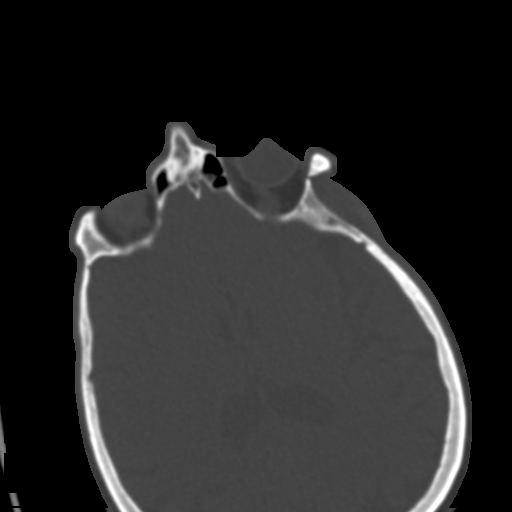
[im 80/90  bone]
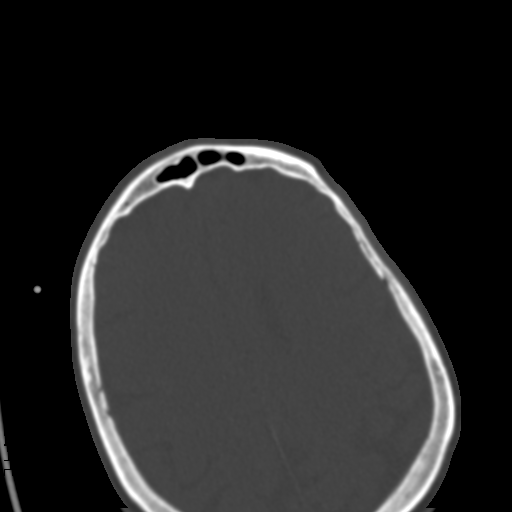

[Series 7: coronal soft tissue · coronal · 0.35mm/px · 3 of 109 slices shown]
[im 37/109  bone]
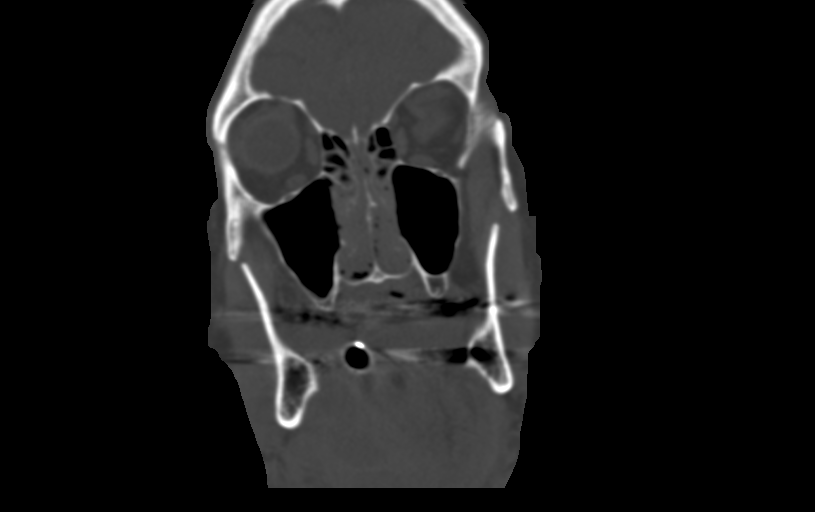
[im 49/109  bone]
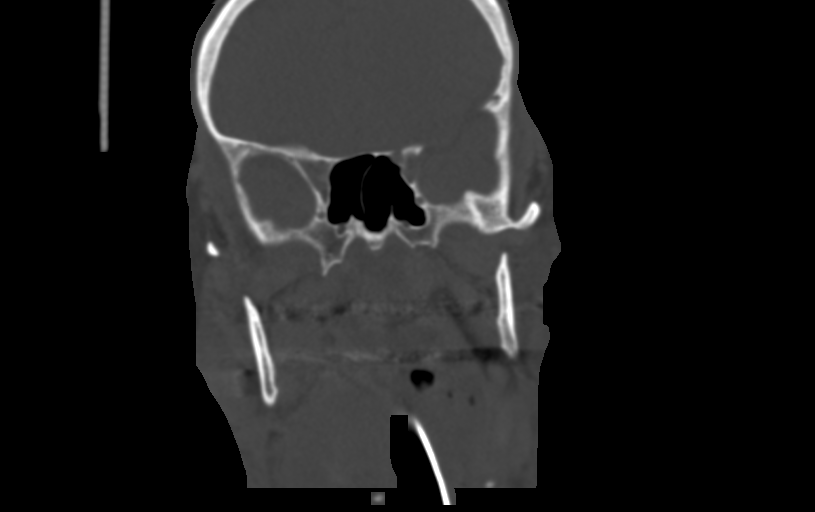
[im 61/109  bone]
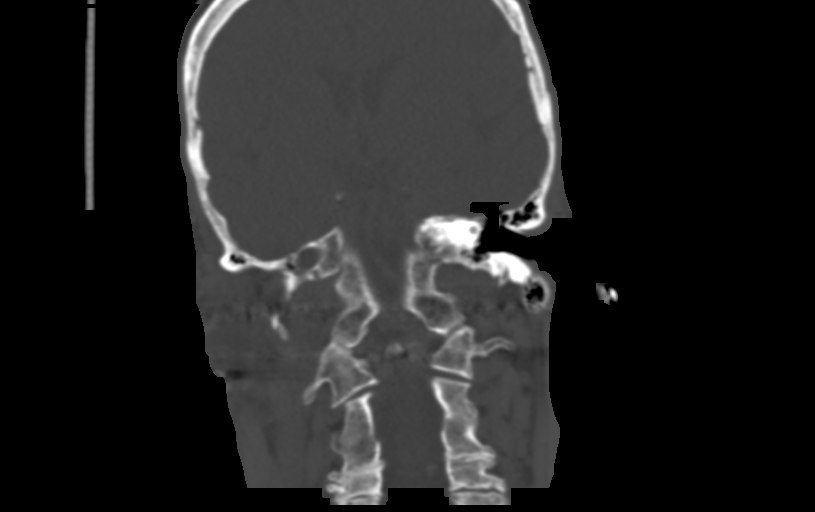

[Series 8: sagittal soft tissue · sagittal · 0.35mm/px · 3 of 80 slices shown]
[im 27/80  bone]
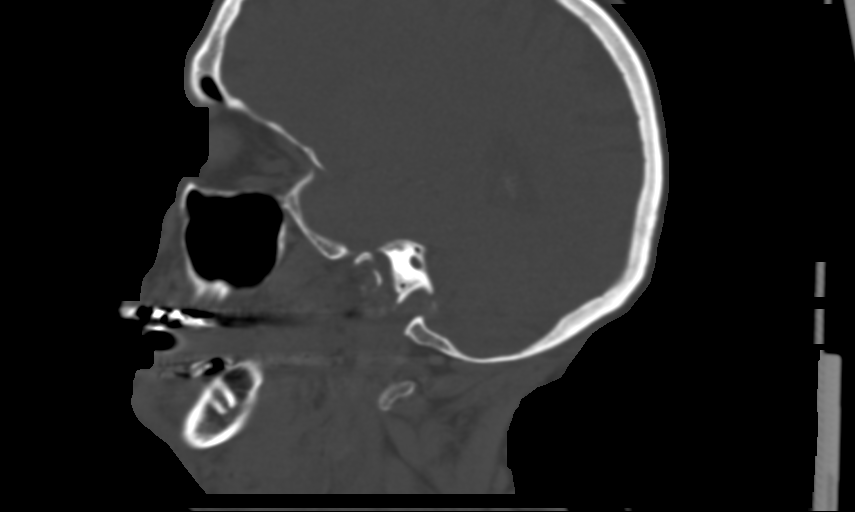
[im 40/80  bone]
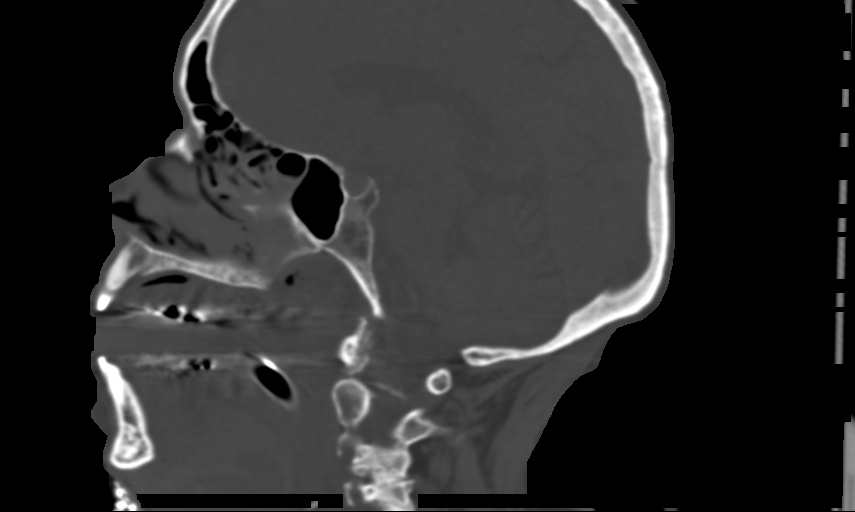
[im 53/80  bone]
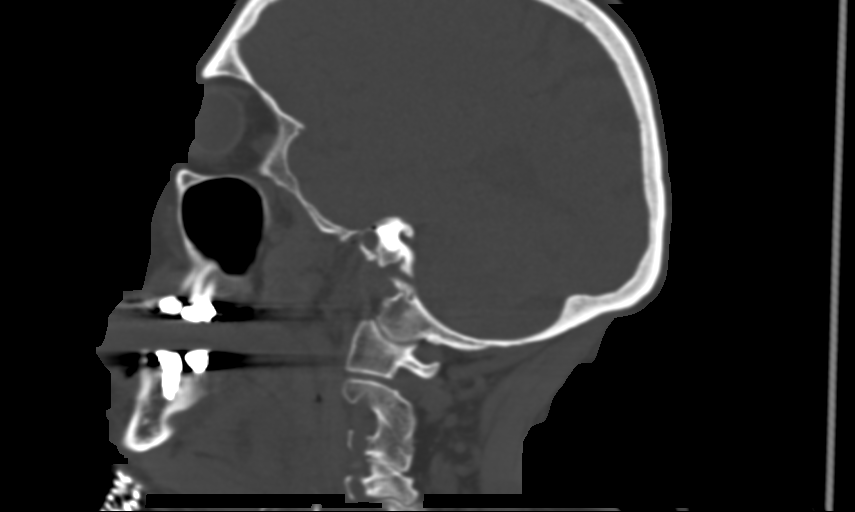

[12 of 47 positions shown; findings below may reference images not displayed]

FINDINGS: CT HEAD FINDINGS

Brain: No evidence of acute infarction, hemorrhage, hydrocephalus,
extra-axial collection or mass lesion/mass effect. Mild generalized
atrophy, age-appropriate.

Vascular: No hyperdense vessel or unexpected calcification.

Skull: Normal. Negative for fracture or focal lesion.

Other: Secretions in the nasal cavity in opacification, scattered
opacification of the ethmoid sinuses in this patient with
endotracheal tube in place.

CT MAXILLOFACIAL FINDINGS

Osseous: No fracture or mandibular dislocation. No destructive
process.

Orbits: Negative. No traumatic or inflammatory finding.

Sinuses: Opacification of nasal cavity and scattered ethmoid
opacification.

Soft tissues: Extensive hematoma throughout the RIGHT neck tracking
into the floor the RIGHT mouth narrowing the airway in the upper
neck, see dedicated CT angiogram of the neck and CT cervical spine
below for further detail.

CT CERVICAL SPINE FINDINGS

Alignment: Accentuation of normal lordotic curvature in the setting
of degenerative change likely positional and potentially related to
spasm in the setting of a large hematoma in the neck.

Skull base and vertebrae: No acute fracture. No primary bone lesion
or focal pathologic process.

Soft tissues and spinal canal: Extensive hematoma in the RIGHT neck,
see CT angiogram of the neck for further detail, incompletely imaged
on the current exam measuring as great as 5 cm in greatest axial
dimension with some mass effect upon the airway. Patient is
intubated.

Disc levels: Multilevel spinal degenerative changes with facet
arthropathy greatest at C2-3. Disc space narrowing also at C2-3 and
C3-4. Disc space narrowing is greatest in the cervical spine at C5-6
with mild retrolisthesis of C5 on C6 and uncovertebral spurring.
Multilevel facet arthropathy.

Upper chest: Negative.

Other: None
IMPRESSION: 1. No acute intracranial abnormality.
2. No evidence of acute fracture or traumatic subluxation of the
cervical spine.
3. Extensive hematoma throughout the RIGHT neck tracking into the
floor the RIGHT mouth narrowing the airway in the upper neck, see
dedicated CT angiogram of the neck for further detail.
4. No evidence of acute facial bone fracture.
5. Multilevel spinal degenerative changes of the cervical spine,
greatest in the cervical spine at C5-6 with mild retrolisthesis of
C5 on C6 and uncovertebral spurring.

## 2022-03-20 DIAGNOSIS — M791 Myalgia, unspecified site: Secondary | ICD-10-CM | POA: Diagnosis not present

## 2022-03-20 DIAGNOSIS — R82998 Other abnormal findings in urine: Secondary | ICD-10-CM | POA: Diagnosis not present

## 2022-03-20 DIAGNOSIS — G43719 Chronic migraine without aura, intractable, without status migrainosus: Secondary | ICD-10-CM | POA: Diagnosis not present

## 2022-03-20 DIAGNOSIS — M542 Cervicalgia: Secondary | ICD-10-CM | POA: Diagnosis not present

## 2022-03-20 DIAGNOSIS — G518 Other disorders of facial nerve: Secondary | ICD-10-CM | POA: Diagnosis not present

## 2022-03-21 DIAGNOSIS — L608 Other nail disorders: Secondary | ICD-10-CM | POA: Diagnosis not present

## 2022-03-21 DIAGNOSIS — F419 Anxiety disorder, unspecified: Secondary | ICD-10-CM | POA: Diagnosis not present

## 2022-03-21 DIAGNOSIS — G4700 Insomnia, unspecified: Secondary | ICD-10-CM | POA: Diagnosis not present

## 2022-03-21 DIAGNOSIS — Z1331 Encounter for screening for depression: Secondary | ICD-10-CM | POA: Diagnosis not present

## 2022-03-21 DIAGNOSIS — M8588 Other specified disorders of bone density and structure, other site: Secondary | ICD-10-CM | POA: Diagnosis not present

## 2022-03-21 DIAGNOSIS — Z1339 Encounter for screening examination for other mental health and behavioral disorders: Secondary | ICD-10-CM | POA: Diagnosis not present

## 2022-03-21 DIAGNOSIS — Z0000 Encounter for general adult medical examination without abnormal findings: Secondary | ICD-10-CM | POA: Diagnosis not present

## 2022-03-21 DIAGNOSIS — M25569 Pain in unspecified knee: Secondary | ICD-10-CM | POA: Diagnosis not present

## 2022-03-21 DIAGNOSIS — E785 Hyperlipidemia, unspecified: Secondary | ICD-10-CM | POA: Diagnosis not present

## 2022-05-01 DIAGNOSIS — M7918 Myalgia, other site: Secondary | ICD-10-CM | POA: Diagnosis not present

## 2022-05-01 DIAGNOSIS — L729 Follicular cyst of the skin and subcutaneous tissue, unspecified: Secondary | ICD-10-CM | POA: Diagnosis not present

## 2022-06-14 DIAGNOSIS — M542 Cervicalgia: Secondary | ICD-10-CM | POA: Diagnosis not present

## 2022-06-14 DIAGNOSIS — G518 Other disorders of facial nerve: Secondary | ICD-10-CM | POA: Diagnosis not present

## 2022-06-14 DIAGNOSIS — G43719 Chronic migraine without aura, intractable, without status migrainosus: Secondary | ICD-10-CM | POA: Diagnosis not present

## 2022-06-14 DIAGNOSIS — M791 Myalgia, unspecified site: Secondary | ICD-10-CM | POA: Diagnosis not present

## 2022-07-17 DIAGNOSIS — Z79899 Other long term (current) drug therapy: Secondary | ICD-10-CM | POA: Diagnosis not present

## 2022-07-17 DIAGNOSIS — J309 Allergic rhinitis, unspecified: Secondary | ICD-10-CM | POA: Diagnosis not present

## 2022-07-17 DIAGNOSIS — E785 Hyperlipidemia, unspecified: Secondary | ICD-10-CM | POA: Diagnosis not present

## 2022-07-17 DIAGNOSIS — G43709 Chronic migraine without aura, not intractable, without status migrainosus: Secondary | ICD-10-CM | POA: Diagnosis not present

## 2022-09-13 DIAGNOSIS — G518 Other disorders of facial nerve: Secondary | ICD-10-CM | POA: Diagnosis not present

## 2022-09-13 DIAGNOSIS — M542 Cervicalgia: Secondary | ICD-10-CM | POA: Diagnosis not present

## 2022-09-13 DIAGNOSIS — M791 Myalgia, unspecified site: Secondary | ICD-10-CM | POA: Diagnosis not present

## 2022-09-13 DIAGNOSIS — G43719 Chronic migraine without aura, intractable, without status migrainosus: Secondary | ICD-10-CM | POA: Diagnosis not present

## 2022-10-19 DIAGNOSIS — Z1231 Encounter for screening mammogram for malignant neoplasm of breast: Secondary | ICD-10-CM | POA: Diagnosis not present

## 2022-10-27 DIAGNOSIS — M8588 Other specified disorders of bone density and structure, other site: Secondary | ICD-10-CM | POA: Diagnosis not present

## 2022-10-27 DIAGNOSIS — N959 Unspecified menopausal and perimenopausal disorder: Secondary | ICD-10-CM | POA: Diagnosis not present

## 2022-11-14 DIAGNOSIS — H5203 Hypermetropia, bilateral: Secondary | ICD-10-CM | POA: Diagnosis not present

## 2022-11-14 DIAGNOSIS — Z961 Presence of intraocular lens: Secondary | ICD-10-CM | POA: Diagnosis not present

## 2022-12-07 DIAGNOSIS — L570 Actinic keratosis: Secondary | ICD-10-CM | POA: Diagnosis not present

## 2022-12-07 DIAGNOSIS — L821 Other seborrheic keratosis: Secondary | ICD-10-CM | POA: Diagnosis not present

## 2022-12-07 DIAGNOSIS — D485 Neoplasm of uncertain behavior of skin: Secondary | ICD-10-CM | POA: Diagnosis not present

## 2022-12-07 DIAGNOSIS — L720 Epidermal cyst: Secondary | ICD-10-CM | POA: Diagnosis not present

## 2022-12-13 DIAGNOSIS — G518 Other disorders of facial nerve: Secondary | ICD-10-CM | POA: Diagnosis not present

## 2022-12-13 DIAGNOSIS — M542 Cervicalgia: Secondary | ICD-10-CM | POA: Diagnosis not present

## 2022-12-13 DIAGNOSIS — M791 Myalgia, unspecified site: Secondary | ICD-10-CM | POA: Diagnosis not present

## 2022-12-13 DIAGNOSIS — G43719 Chronic migraine without aura, intractable, without status migrainosus: Secondary | ICD-10-CM | POA: Diagnosis not present

## 2022-12-18 DIAGNOSIS — D1801 Hemangioma of skin and subcutaneous tissue: Secondary | ICD-10-CM | POA: Diagnosis not present

## 2022-12-18 DIAGNOSIS — L819 Disorder of pigmentation, unspecified: Secondary | ICD-10-CM | POA: Diagnosis not present

## 2022-12-18 DIAGNOSIS — L821 Other seborrheic keratosis: Secondary | ICD-10-CM | POA: Diagnosis not present

## 2022-12-18 DIAGNOSIS — L570 Actinic keratosis: Secondary | ICD-10-CM | POA: Diagnosis not present

## 2022-12-18 DIAGNOSIS — R208 Other disturbances of skin sensation: Secondary | ICD-10-CM | POA: Diagnosis not present

## 2023-01-05 DIAGNOSIS — F329 Major depressive disorder, single episode, unspecified: Secondary | ICD-10-CM | POA: Diagnosis not present

## 2023-01-05 DIAGNOSIS — G2581 Restless legs syndrome: Secondary | ICD-10-CM | POA: Diagnosis not present

## 2023-01-05 DIAGNOSIS — G43909 Migraine, unspecified, not intractable, without status migrainosus: Secondary | ICD-10-CM | POA: Diagnosis not present

## 2023-01-05 DIAGNOSIS — F419 Anxiety disorder, unspecified: Secondary | ICD-10-CM | POA: Diagnosis not present

## 2023-01-05 DIAGNOSIS — G4700 Insomnia, unspecified: Secondary | ICD-10-CM | POA: Diagnosis not present

## 2023-01-05 DIAGNOSIS — J309 Allergic rhinitis, unspecified: Secondary | ICD-10-CM | POA: Diagnosis not present

## 2023-01-05 DIAGNOSIS — M1990 Unspecified osteoarthritis, unspecified site: Secondary | ICD-10-CM | POA: Diagnosis not present

## 2023-01-05 DIAGNOSIS — I10 Essential (primary) hypertension: Secondary | ICD-10-CM | POA: Diagnosis not present

## 2023-01-05 DIAGNOSIS — E785 Hyperlipidemia, unspecified: Secondary | ICD-10-CM | POA: Diagnosis not present

## 2023-03-14 DIAGNOSIS — G518 Other disorders of facial nerve: Secondary | ICD-10-CM | POA: Diagnosis not present

## 2023-03-14 DIAGNOSIS — M791 Myalgia, unspecified site: Secondary | ICD-10-CM | POA: Diagnosis not present

## 2023-03-14 DIAGNOSIS — G43719 Chronic migraine without aura, intractable, without status migrainosus: Secondary | ICD-10-CM | POA: Diagnosis not present

## 2023-03-14 DIAGNOSIS — M542 Cervicalgia: Secondary | ICD-10-CM | POA: Diagnosis not present

## 2023-03-19 DIAGNOSIS — E785 Hyperlipidemia, unspecified: Secondary | ICD-10-CM | POA: Diagnosis not present

## 2023-03-19 DIAGNOSIS — E559 Vitamin D deficiency, unspecified: Secondary | ICD-10-CM | POA: Diagnosis not present

## 2023-03-26 DIAGNOSIS — Z1339 Encounter for screening examination for other mental health and behavioral disorders: Secondary | ICD-10-CM | POA: Diagnosis not present

## 2023-03-26 DIAGNOSIS — R82998 Other abnormal findings in urine: Secondary | ICD-10-CM | POA: Diagnosis not present

## 2023-03-26 DIAGNOSIS — L608 Other nail disorders: Secondary | ICD-10-CM | POA: Diagnosis not present

## 2023-03-26 DIAGNOSIS — E785 Hyperlipidemia, unspecified: Secondary | ICD-10-CM | POA: Diagnosis not present

## 2023-03-26 DIAGNOSIS — M8588 Other specified disorders of bone density and structure, other site: Secondary | ICD-10-CM | POA: Diagnosis not present

## 2023-03-26 DIAGNOSIS — Z0000 Encounter for general adult medical examination without abnormal findings: Secondary | ICD-10-CM | POA: Diagnosis not present

## 2023-03-26 DIAGNOSIS — Z1331 Encounter for screening for depression: Secondary | ICD-10-CM | POA: Diagnosis not present

## 2023-03-26 DIAGNOSIS — F419 Anxiety disorder, unspecified: Secondary | ICD-10-CM | POA: Diagnosis not present

## 2023-03-26 DIAGNOSIS — G4700 Insomnia, unspecified: Secondary | ICD-10-CM | POA: Diagnosis not present

## 2023-05-21 ENCOUNTER — Telehealth: Payer: Self-pay | Admitting: Gastroenterology

## 2023-05-21 NOTE — Telephone Encounter (Signed)
She had her last colonoscopy in July 2018, there was 1 small adenoma removed.  At the time we recommended a repeat exam in 5 years, however national surveillance guidelines have since changed and she would not be due for 7 years from her last exam, next due in July 2025.  If she is otherwise in good health and wants to have 1 more exam before stopping, I think that is reasonable and can book it at the [facility], however tell her I do not think she is due until 12/2023.  Thanks

## 2023-05-21 NOTE — Telephone Encounter (Signed)
Good morning, Dr. Adela Lank  We received a call from this patient wishing to schedule an appointment for a colonoscopy. Patient is over the age of 65. Would you like to see patient in the office prior to scheduling or are we able to schedule colonoscopy directly? Please advise on scheduling.  Thank you.

## 2023-05-28 NOTE — Telephone Encounter (Signed)
Patient advised.

## 2023-06-13 DIAGNOSIS — M791 Myalgia, unspecified site: Secondary | ICD-10-CM | POA: Diagnosis not present

## 2023-06-13 DIAGNOSIS — M542 Cervicalgia: Secondary | ICD-10-CM | POA: Diagnosis not present

## 2023-06-13 DIAGNOSIS — G518 Other disorders of facial nerve: Secondary | ICD-10-CM | POA: Diagnosis not present

## 2023-06-13 DIAGNOSIS — G43719 Chronic migraine without aura, intractable, without status migrainosus: Secondary | ICD-10-CM | POA: Diagnosis not present

## 2023-07-23 DIAGNOSIS — E785 Hyperlipidemia, unspecified: Secondary | ICD-10-CM | POA: Diagnosis not present

## 2023-07-23 DIAGNOSIS — G43709 Chronic migraine without aura, not intractable, without status migrainosus: Secondary | ICD-10-CM | POA: Diagnosis not present

## 2023-07-23 DIAGNOSIS — Z79899 Other long term (current) drug therapy: Secondary | ICD-10-CM | POA: Diagnosis not present

## 2023-07-23 DIAGNOSIS — J309 Allergic rhinitis, unspecified: Secondary | ICD-10-CM | POA: Diagnosis not present

## 2023-08-16 DIAGNOSIS — L300 Nummular dermatitis: Secondary | ICD-10-CM | POA: Diagnosis not present

## 2023-09-12 DIAGNOSIS — M791 Myalgia, unspecified site: Secondary | ICD-10-CM | POA: Diagnosis not present

## 2023-09-12 DIAGNOSIS — G43719 Chronic migraine without aura, intractable, without status migrainosus: Secondary | ICD-10-CM | POA: Diagnosis not present

## 2023-09-12 DIAGNOSIS — M542 Cervicalgia: Secondary | ICD-10-CM | POA: Diagnosis not present

## 2023-09-12 DIAGNOSIS — G518 Other disorders of facial nerve: Secondary | ICD-10-CM | POA: Diagnosis not present

## 2023-09-13 DIAGNOSIS — N898 Other specified noninflammatory disorders of vagina: Secondary | ICD-10-CM | POA: Diagnosis not present

## 2023-09-13 DIAGNOSIS — F419 Anxiety disorder, unspecified: Secondary | ICD-10-CM | POA: Diagnosis not present

## 2023-09-13 DIAGNOSIS — G2581 Restless legs syndrome: Secondary | ICD-10-CM | POA: Diagnosis not present

## 2023-09-13 DIAGNOSIS — G4700 Insomnia, unspecified: Secondary | ICD-10-CM | POA: Diagnosis not present

## 2023-09-13 DIAGNOSIS — G4730 Sleep apnea, unspecified: Secondary | ICD-10-CM | POA: Diagnosis not present

## 2023-10-15 ENCOUNTER — Encounter: Payer: Self-pay | Admitting: Gastroenterology

## 2023-10-26 DIAGNOSIS — Z1231 Encounter for screening mammogram for malignant neoplasm of breast: Secondary | ICD-10-CM | POA: Diagnosis not present

## 2023-10-29 ENCOUNTER — Institutional Professional Consult (permissible substitution): Admitting: Neurology

## 2023-11-01 ENCOUNTER — Encounter: Payer: Self-pay | Admitting: Neurology

## 2023-11-01 ENCOUNTER — Ambulatory Visit: Admitting: Neurology

## 2023-11-01 VITALS — BP 91/60 | HR 71 | Ht 65.0 in | Wt 123.0 lb

## 2023-11-01 DIAGNOSIS — R0683 Snoring: Secondary | ICD-10-CM | POA: Diagnosis not present

## 2023-11-01 DIAGNOSIS — Z8669 Personal history of other diseases of the nervous system and sense organs: Secondary | ICD-10-CM | POA: Diagnosis not present

## 2023-11-01 DIAGNOSIS — R351 Nocturia: Secondary | ICD-10-CM | POA: Diagnosis not present

## 2023-11-01 NOTE — Patient Instructions (Signed)
 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   2)  nocturia   3)  snoring , hx of OSA.  Dental device user.  Wakes with a very dry mouth and has dry eyes too.   I plan to reevaluate this patient by HST ( watch Pat ) for a new baseline that would allow her to obtain a new dental device.   I plan to follow up either personally or through our NP within 3-5 months.

## 2023-11-01 NOTE — Progress Notes (Signed)
 SLEEP MEDICINE CLINIC    Provider:  Neomia Banner, MD  Primary Care Physician:  Barnetta Liberty, MD [address] [place] [address]     Referring Provider: Barnetta Liberty, Md [address] [place],  [place] [address]          Chief Complaint according to patient   Patient presents with:     New Patient (Initial Visit)           HISTORY OF PRESENT ILLNESS:  Dana Caldwell is a 78 y.o. female patient who is seen upon referral on 11/01/2023 from Dr Adelaide Holy and Dr Jolee Naval, MD,  for a new sleep study baseline .  She is an established [facility] patient and her headaches improved on treatment with botox and with sleep apnea treatment.  Chief concern according to patient :  " last sleep study was in 2006, she was dx with mild apnea, has RLS, she got a Moses dental device, and she needs a new one. Here for documentation of current level of apnea for CMS coverage.  She still snores some , BMI 20, 14 neck. "   I have the pleasure of seeing Dana Caldwell 11/01/23 a right-handed female with a Hx of sleep apnea , RLS and migraines.     Sleep relevant medical history: Nocturia 2-4 times, Migraine, dreamless sleep.Pt alone, rm 1. Here because she was using a dental device for osa diagnosed in 2002. She used CPAP and then switched over to dental device. For years she has used it and overall she did well. She states there has been some anatomy changes and her dental device doesn't fit as good.    Family medical /sleep history: strong migraine history in mother ( who lived  to 69 years of age) and maternal GM, and strokes in father , cousin. Younger brother with vascular dementia and with OSA another brother died at 55 with brain tumor .Daughter with breast cancer dx.   Social history: caretaker of her husband with fronto temporal dementia and DM,  Patient is retried Investment banker, operational  and lives in a household with spouse,  one dog.  Family status  is married , with 2 adult daughter, one lives in [place].  Tobacco use: none .  ETOH use : one drink a week or less ,  Caffeine intake in form of Coffee( 1 cup in AM ) Soda( /) Tea ( /) nor energy drinks Exercise in form of walking , yoga,  .   Hobbies : pickle ball , needle point       Sleep habits are as follows: The patient's dinner time is between 6-7.30 PM. The patient goes to bed at 10-11 PM and continues to sleep for intervals of 2-3 hours , total of 6-7  hours, wakes for 2-4 bathroom breaks, the first time at 2 AM.- Gets up at 6 AM.  The preferred sleep position is lateral or prone, with the support of 1-2 pillows. Dreams are reportedly rare. The patient wakes up spontaneously/ with an alarm. 6  AM is the usual rise time. She reports not feeling refreshed or restored in AM, with symptoms such as dry mouth, but rarely with morning headaches, and residual fatigue.  Naps are taken infrequently, used not to take any but now tries to get a power nap  lasting from 20 to 30 minutes and are more refreshing than nocturnal sleep.  She feels that is centered her thoughts.  Review of Systems: Out of a complete 14 system review, the patient complains of only the following symptoms, and all other reviewed systems are negative.:  Fatigue, sleepiness , snoring, fragmented sleep, Nocturia, RLS some nights.  ADD/ ADHD   How likely are you to doze in the following situations: 0 = not likely, 1 = slight chance, 2 = moderate chance, 3 = high chance   Sitting and Reading? Watching Television? Sitting inactive in a public place (theater or meeting)? As a passenger in a car for an hour without a break? Lying down in the afternoon when circumstances permit? Sitting and talking to someone? Sitting quietly after lunch without alcohol ? In a car, while stopped for a few minutes in traffic?   Total = 8/ 24 points   FSS endorsed at na/ 63 points.   Social History   Socioeconomic History   Marital status:  Married    Spouse name: Not on file   Number of children: Not on file   Years of education: Not on file   Highest education level: Not on file  Occupational History   Not on file  Tobacco Use   Smoking status: Never   Smokeless tobacco: Never  Substance and Sexual Activity   Alcohol  use: Yes    Alcohol /week: 1.0 - 2.0 standard drink of alcohol     Types: 1 - 2 Standard drinks or equivalent per week   Drug use: No   Sexual activity: Yes  Other Topics Concern   Not on file  Social History Narrative   Not on file   Social Drivers of Health   Financial Resource Strain: Not on file  Food Insecurity: Not on file  Transportation Needs: Not on file  Physical Activity: Not on file  Stress: Not on file  Social Connections: Not on file    Family History  Problem Relation Age of Onset   Stroke Father        > 10   Multiple sclerosis Mother    Heart attack Brother         two brothers;52& 60   Stroke Maternal Grandfather 72   Breast cancer Paternal Grandmother    Coronary artery disease Brother        CBAG @ 47   Diabetes Brother    Stroke Brother    Diabetes Paternal Uncle    Heart attack Paternal Uncle        in 57s   Renal cancer Brother        cns mets   Colon cancer Neg Hx    Esophageal cancer Neg Hx    Stomach cancer Neg Hx    Rectal cancer Neg Hx     Past Medical History:  Diagnosis Date   Allergic rhinitis    Anxiety    Anxiety disorder    controlled with meds   Arthritis    thumbs   Fibrocystic breast disease    right breast   Hyperlipidemia    NMR 2007: LDL 222(3721/2730), HDL 52,TG 176). LDL goal +  < 115   Migraine headache    Dr Margie Sheller   Sleep apnea    no c-pap/uses mouth piece    Past Surgical History:  Procedure Laterality Date   BREAST LUMPECTOMY     right   BREAST REDUCTION SURGERY  2008   COLONOSCOPY  1999   Negative ; Dr Grandville Lax   COLONOSCOPY W/ POLYPECTOMY  2013   Tubular adenoma;Dr Grandville Lax   KNEE SURGERY  right/ torn meniscus    NECK SURGERY  2021   stab phlebectomy  Left 08/21/2017   stab phlebectomy 10-20 incisions left leg by Merced Stair MD   TONSILLECTOMY     VAGINAL DELIVERY     X 2   WOUND EXPLORATION Right 05/19/2020   Procedure: RIGHT NECK EXPLORATION;  Surgeon: Junie Olds, MD;  Location: [facility] OR;  Service: General;  Laterality: Right;     Current Outpatient Medications on File Prior to Visit  Medication Sig Dispense Refill   ALPRAZolam  (XANAX ) 0.25 MG tablet Takes 0/5 tablet as needed (Patient taking differently: Take 0.25 mg by mouth as needed for anxiety.) 30 tablet 0   baclofen (LIORESAL) 10 MG tablet Take 10 mg by mouth as needed for muscle spasms.     cholecalciferol (VITAMIN D ) 1000 UNITS tablet Take 4,000 Units by mouth daily.     diclofenac (VOLTAREN) 50 MG EC tablet Take 50 mg by mouth as needed for moderate pain.      ezetimibe -simvastatin  (VYTORIN ) 10-40 MG per tablet take 1 tablet by mouth once daily (Patient taking differently: Take 1 tablet by mouth at bedtime.) 90 tablet 1   Omega-3 Fatty Acids (FISH OIL) 1000 MG CAPS Take 1,400 mg by mouth daily.     OnabotulinumtoxinA (BOTOX IJ) Inject as directed. Botox injection every 3 months.     TURMERIC PO Take 1 tablet by mouth 2 (two) times daily.      zolpidem (AMBIEN) 5 MG tablet Take 5 mg by mouth at bedtime as needed for sleep.     No current facility-administered medications on file prior to visit.    Allergies  Allergen Reactions   Penicillins     Hives while on PCN AND Sulfa   Sulfonamide Derivatives     Hives while on PCN AND Sulfa     DIAGNOSTIC DATA (LABS, IMAGING, TESTING) - I reviewed patient records, labs, notes, testing and imaging myself where available.  Lab Results  Component Value Date   WBC 8.3 05/21/2020   HGB 10.3 (L) 05/21/2020   HCT 30.9 (L) 05/21/2020   MCV 96.6 05/21/2020   PLT 174 05/21/2020      Component Value Date/Time   NA 140 05/21/2020 0037   K 3.4 (L) 05/21/2020 0037   CL 107  05/21/2020 0037   CO2 24 05/21/2020 0037   GLUCOSE 105 (H) 05/21/2020 0037   BUN 11 05/21/2020 0037   CREATININE 0.58 05/21/2020 0037   CALCIUM 8.3 (L) 05/21/2020 0037   PROT 5.9 (L) 05/19/2020 2106   ALBUMIN 3.8 05/19/2020 2106   AST 28 05/19/2020 2106   ALT 19 05/19/2020 2106   ALKPHOS 45 05/19/2020 2106   BILITOT 0.9 05/19/2020 2106   GFRNONAA >60 05/21/2020 0037   GFRAA 93 04/30/2008 0830   Lab Results  Component Value Date   CHOL 181 09/17/2013   HDL 88.60 09/17/2013   LDLCALC 82 09/17/2013   TRIG 46 05/20/2020   CHOLHDL 2 09/17/2013   No results found for: "HGBA1C" No results found for: "VITAMINB12" Lab Results  Component Value Date   TSH 2.34 09/17/2013    PHYSICAL EXAM:  Today's Vitals   11/01/23 0859  BP: 91/60  Pulse: 71  Weight: 123 lb (55.8 kg)  Height: 5\' 5"  (1.651 m)   Body mass index is 20.47 kg/m.   Wt Readings from Last 3 Encounters:  11/01/23 123 lb (55.8 kg)  05/19/20 130 lb (59 kg)  08/21/17 129 lb 6.4 oz (58.7  kg)     Ht Readings from Last 3 Encounters:  11/01/23 5\' 5"  (1.651 m)  05/19/20 5\' 5"  (1.651 m)  08/21/17 5' 5.5" (1.664 m)      General: The patient is awake, alert and appears not in acute distress. The patient is well groomed. Head: Normocephalic, atraumatic. Neck is supple. Mallampati 2,  neck circumference:13.5 inches .  Nasal airflow  patent.   overbite - dry mouth.  Dental status: biological  Cardiovascular:  Regular rate and cardiac rhythm by pulse,  without distended neck veins. Respiratory: Lungs are clear to auscultation.  Skin:  Without evidence of ankle edema, or rash. Trunk: The patient's posture is erect.   NEUROLOGIC EXAM: The patient is awake and alert, oriented to place and time.  she is a bit hyperactive and frazzled  Memory subjective described as impaired to word finding  Attention span & concentration ability appears normal.  Speech is fluent,  without  dysarthria, dysphonia or aphasia.  Mood and  affect are appropriate.   Cranial nerves: no loss of smell or taste reported  Pupils are equal and briskly reactive to light. Funduscopic exam def.  Extraocular movements in vertical and horizontal planes were intact and without nystagmus. No Diplopia. Visual fields by finger perimetry are intact.  Hearing was intact to soft voice and finger rubbing.    Facial sensation intact to fine touch.  Facial motor strength is symmetric and tongue and uvula move midline.  Neck ROM : rotation, tilt and flexion extension were normal for age and shoulder shrug was symmetrical.    Motor exam:  Symmetric bulk, tone and ROM.   Normal tone without cog wheeling, symmetric grip strength .   Sensory:  vibration  tested and was  normal.  Left big toe is numb to touch.  Proprioception tested in the upper extremities was normal.   Coordination: Rapid alternating movements in the fingers/hands were of normal speed.  The Finger-to-nose maneuver was intact without evidence of ataxia, dysmetria or tremor.   Gait and station: Patient could rise unassisted from a seated position, walked without assistive device.  Deep tendon reflexes: in the  upper and lower extremities are symmetric and intact.  Babinski response was deferred .    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   2)  nocturia   3)  snoring , hx of OSA.  Dental device user.  Wakes with a very dry mouth and has dry eyes too.   I plan to reevaluate this patient by HST ( watch Pat ) for a new baseline that would allow her to obtain a new dental device.   I plan to follow up either personally or through our NP within 3-5 months.   I would like to thank Barnetta Liberty, [place] [address] [place],  [place] [address] for allowing me to meet with and to take care of this pleasant patient.     After  spending a total time of  35  minutes face to face and additional time for physical and neurologic examination, review of laboratory studies,  personal review of imaging studies, reports and results of other testing and review of referral information / records as far as provided in visit,   Electronically signed by: Neomia Banner, MD 11/01/2023 9:09 AM  [facility] Board certified by The American  Board of Sleep Medicine and Diplomate of the American Academy of Sleep Medicine. Board certified In Neurology through the ABPN, Fellow of the [employer].

## 2023-12-04 ENCOUNTER — Ambulatory Visit (INDEPENDENT_AMBULATORY_CARE_PROVIDER_SITE_OTHER): Admitting: Neurology

## 2023-12-04 DIAGNOSIS — R351 Nocturia: Secondary | ICD-10-CM

## 2023-12-04 DIAGNOSIS — G4733 Obstructive sleep apnea (adult) (pediatric): Secondary | ICD-10-CM

## 2023-12-04 DIAGNOSIS — Z8669 Personal history of other diseases of the nervous system and sense organs: Secondary | ICD-10-CM

## 2023-12-04 DIAGNOSIS — R0683 Snoring: Secondary | ICD-10-CM

## 2023-12-05 ENCOUNTER — Telehealth: Payer: Self-pay

## 2023-12-05 NOTE — Telephone Encounter (Signed)
 While I was preparing this patients chart for PV I noticed in Snap Chart that this patient has a red flag for difficult airway.She is scheduled for [facility] Friday 12/14/23. Please review this chart and make sure that this patient is okay for [facility] or does she need to have her procedure at the hospital? Please advise.   Luanna Rung, LPN ( PV )

## 2023-12-05 NOTE — Telephone Encounter (Signed)
 Thanks for noticing.  Please cancel her currently scheduled procedure in the [facility] and please book her for an office visit with me to further discuss, given her age, to clarify how she wishes to proceed. Thanks

## 2023-12-05 NOTE — Progress Notes (Signed)
 [facility]    HOME SLEEP TEST REPORT ( by Watch PAT)   STUDY DATE:  12-04-2023   ORDERING CLINICIAN: Neomia Banner, MD  REFERRING CLINICIANJolee Naval, MD  Dr Reinhold Carbine , MD    CLINICAL INFORMATION/HISTORY: Dana Caldwell is a 78 y.o. female patient who is seen upon referral on 11/01/2023 from Dr Adelaide Holy and Dr Jolee Naval, MD,  for a new sleep study baseline .  She is an established headache and wellness center patient and her headaches improved on treatment with botox and with sleep apnea treatment. Fatigue, sleepiness , snoring, fragmented sleep, Nocturia, RLS some nights.  ADD/ ADHD  Chief concern according to patient :   last sleep study was in 2006, she was dx with mild apnea, has RLS, she got a Moses dental device, and she needs a new one. Here for documentation of current level of apnea for CMS coverage.  She still snores some , BMI 20, 14 neck.      Epworth sleepiness score: 8 /24.   BMI: 20.5 kg/m   Neck Circumference: 13.5    FINDINGS:   Sleep Summary:   Total Recording Time (hours, min): 6 hours 33 minutes      Total Sleep Time (hours, min):    5 hours 40 minutes             Percent REM (%):    17.3%                                    Respiratory Indices:   Calculated pAHI (per CMS guideline):.   22.7/h                      REM pAHI: 52.4/h                                               NREM pAHI:   16.3/h                           Positional AHI:   The highest AHI was associated with supine sleep at 51.7/h and the lowest was right lateral sleep at an AHI of 4.6/h. Snoring:     Very strongly associated with supine and REM sleep, reaching a mean volume of 44 dB - snoring was present for 38% of the total sleep time.                                           Oxygen Saturation Statistics:   Oxygen Saturation (%) Mean: 93%              O2 Saturation Range (%):    Between the nadir at 77 and a maximum  saturation of 98%                                   O2 Saturation (minutes) <89%:    12.6 minutes       Pulse Rate  Statistics:   Pulse Mean (bpm):     62 bpm            Pulse Range:   Between 49 and 91 bpm              IMPRESSION:  This HST confirms the presence of strongly REM sleep dependent moderately severe all obstructive sleep apnea. This form of apnea requires positive airway pressure therapy.  Weight loss can reduce REM sleep dependent apnea but this patient's BMI is below average and weight loss is certainly not indicated for a BMI of 20.6   RECOMMENDATION: Surprisingly severe REM sleep dependent sleep apnea in a patient with low weight and associated with hypoxia.  I will order a positive airway pressure therapy with a setting between 6 and 12 cm water pressure 2 cm EPR, heated humidification and an interface of patient's choice.  Since snoring seems to be present for a significant part of the night the patient may either use a nasal interface with a chinstrap or may use a fullface mask.    INTERPRETING PHYSICIAN:   Neomia Banner, MD  [facility] Sleep Board certified by [employer] of Sleep Medicine and Diplomate of the [employer] of [employer]. Board certified In Neurology through the ABPN, Fellow of the [employer].

## 2023-12-07 DIAGNOSIS — Z85828 Personal history of other malignant neoplasm of skin: Secondary | ICD-10-CM | POA: Diagnosis not present

## 2023-12-07 DIAGNOSIS — C44729 Squamous cell carcinoma of skin of left lower limb, including hip: Secondary | ICD-10-CM | POA: Diagnosis not present

## 2023-12-07 HISTORY — PX: LEG SKIN LESION  BIOPSY / EXCISION: SUR473

## 2023-12-09 ENCOUNTER — Ambulatory Visit: Payer: Self-pay | Admitting: Neurology

## 2023-12-09 DIAGNOSIS — R351 Nocturia: Secondary | ICD-10-CM

## 2023-12-09 DIAGNOSIS — Z8669 Personal history of other diseases of the nervous system and sense organs: Secondary | ICD-10-CM

## 2023-12-09 DIAGNOSIS — R0683 Snoring: Secondary | ICD-10-CM

## 2023-12-09 NOTE — Procedures (Signed)
 [facility]    HOME SLEEP TEST REPORT ( by Watch PAT)   STUDY DATE:  12-04-2023   ORDERING CLINICIAN: Neomia Banner, MD  REFERRING CLINICIANJolee Naval, MD  Dr Reinhold Carbine , MD    CLINICAL INFORMATION/HISTORY: Dana Caldwell is a 78 y.o. female patient who is seen upon referral on 11/01/2023 from Dr Adelaide Holy and Dr Jolee Naval, MD,  for a new sleep study baseline .  She is an established headache and wellness center patient and her headaches improved on treatment with botox and with sleep apnea treatment. Fatigue, sleepiness , snoring, fragmented sleep, Nocturia, RLS some nights.  ADD/ ADHD  Chief concern according to patient :   last sleep study was in 2006, she was dx with mild apnea, has RLS, she got a Moses dental device, and she needs a new one. Here for documentation of current level of apnea for CMS coverage.  She still snores some , BMI 20, 14 neck.      Epworth sleepiness score: 8 /24.   BMI: 20.5 kg/m   Neck Circumference: 13.5    FINDINGS:   Sleep Summary:   Total Recording Time (hours, min): 6 hours 33 minutes      Total Sleep Time (hours, min):    5 hours 40 minutes             Percent REM (%):    17.3%                                    Respiratory Indices:   Calculated pAHI (per CMS guideline):.   22.7/h                      REM pAHI: 52.4/h                                               NREM pAHI:   16.3/h                           Positional AHI:   The highest AHI was associated with supine sleep at 51.7/h and the lowest was right lateral sleep at an AHI of 4.6/h. Snoring:     Very strongly associated with supine and REM sleep, reaching a mean volume of 44 dB - snoring was present for 38% of the total sleep time.                                           Oxygen Saturation Statistics:   Oxygen Saturation (%) Mean: 93%              O2 Saturation Range (%):    Between the nadir at 77 and a maximum saturation  of 98%                                   O2 Saturation (minutes) <89%:    12.6 minutes       Pulse Rate Statistics:  Pulse Mean (bpm):     62 bpm            Pulse Range:   Between 49 and 91 bpm              IMPRESSION:  This HST confirms the presence of strongly REM sleep dependent moderately severe all obstructive sleep apnea. This form of apnea requires positive airway pressure therapy.  Weight loss can reduce REM sleep dependent apnea but this patient's BMI is below average and weight loss is certainly not indicated for a BMI of 20.6   RECOMMENDATION: Surprisingly severe REM sleep dependent sleep apnea in a patient with low weight and associated with hypoxia.  I will order a positive airway pressure therapy with a setting between 6 and 12 cm water pressure 2 cm EPR, heated humidification and an interface of patient's choice.  Since snoring seems to be present for a significant part of the night the patient may either use a nasal interface with a chinstrap or may use a fullface mask.  I know that the patient has had reportedly success with a dental device, the Moses device, and if she prefers to continue with dental device .  I would be happy to refer her back to a sleep dentist to get a device made to measure.  I would then however also insist on a follow-up sleep study to see if her apnea is significantly reduced or not.    The distribution of her apnea between REM and non-REM sleep makes me doubt that she can reach significant reduction by a dental device.    INTERPRETING PHYSICIAN:   Neomia Banner, MD  [facility] and [facility] Sleep Board certified by The [employer] of Sleep Medicine and Diplomate of the [employer]. Board certified In Neurology through the ABPN, Fellow of the [employer].

## 2023-12-11 ENCOUNTER — Telehealth: Payer: Self-pay | Admitting: Neurology

## 2023-12-11 NOTE — Telephone Encounter (Signed)
 Attempted to reach patient concerning the need for the patient to call back to the office for information; Unable to speak with patient -left message for patient to call the office back at [phone]

## 2023-12-11 NOTE — Telephone Encounter (Signed)
 Referral fro Dentistry Faxed to [facility]  [facility] Phone # [phone] Fax# [phone]

## 2023-12-11 NOTE — Telephone Encounter (Signed)
-----   Message from Goessel Dohmeier sent at 12/09/2023 10:55 PM EDT ----- I know that the patient has had reportedly success with a dental device, the Moses device, and if she prefers to continue with dental device .  I would be happy to refer her back to a sleep dentist to get a device made to measure.  I would then however also insist on a follow-up sleep study to see if her apnea is significantly reduced or  not.    The distribution of her apnea between REM and non-REM sleep makes me doubt that she can reach significant reduction by a dental device.  My plan A would be CPAP.   ----- Message ----- From: Neomia Banner, MD Sent: 12/09/2023  10:55 PM EDT To: Neomia Banner, MD

## 2023-12-11 NOTE — Telephone Encounter (Signed)
 Called the patient and reviewed the sleep study results.  Advised of the moderate to severe apnea that was present.  Informed the patient that her plan a would be to do CPAP but that she understands that she has been using a dental device.  Patient would prefer to continue with the dental device.  She was last set up with Dr. Abel Abelson and would like to to Dr. Abel Abelson.  Advised that once she has the dental device made to see about repeating a study with the dental device.  Informed the patient I believe Dr. Abel Abelson does that in her practice but if she does not it would be beneficial for her to follow-up with us  once the dental device is made to determine repeating a study with a device. Patient verbalized understanding and requested a copy of the sleep study be placed in the mail.  Confirmed addressed.

## 2023-12-12 DIAGNOSIS — M791 Myalgia, unspecified site: Secondary | ICD-10-CM | POA: Diagnosis not present

## 2023-12-12 DIAGNOSIS — M542 Cervicalgia: Secondary | ICD-10-CM | POA: Diagnosis not present

## 2023-12-12 DIAGNOSIS — G43719 Chronic migraine without aura, intractable, without status migrainosus: Secondary | ICD-10-CM | POA: Diagnosis not present

## 2023-12-12 DIAGNOSIS — G518 Other disorders of facial nerve: Secondary | ICD-10-CM | POA: Diagnosis not present

## 2023-12-12 NOTE — Telephone Encounter (Signed)
 Attempt to reach pt to address issue. LM with call back #

## 2023-12-12 NOTE — Telephone Encounter (Signed)
 Patient scheduled for ov. Please f/u if needing to further advise.

## 2023-12-12 NOTE — Telephone Encounter (Signed)
 Informed pt reason for changes in care which is due to her hx of difficult intubation. Informed  her that since she has that been so long since she has been in the office they are bringing her in to do a good evaluation of her current health and make the best decision to preceded. Pt had no other questions at this time.

## 2023-12-12 NOTE — Telephone Encounter (Signed)
 Patient is calling back and stated that she is now concerned on why she is having to reschedule her procedure several times. Patient is requesting a call back. Please advise.

## 2023-12-14 ENCOUNTER — Encounter

## 2024-01-01 ENCOUNTER — Encounter: Admitting: Gastroenterology

## 2024-01-03 DIAGNOSIS — G4700 Insomnia, unspecified: Secondary | ICD-10-CM | POA: Diagnosis not present

## 2024-01-03 DIAGNOSIS — Z636 Dependent relative needing care at home: Secondary | ICD-10-CM | POA: Diagnosis not present

## 2024-01-03 DIAGNOSIS — G4730 Sleep apnea, unspecified: Secondary | ICD-10-CM | POA: Diagnosis not present

## 2024-01-03 DIAGNOSIS — G2581 Restless legs syndrome: Secondary | ICD-10-CM | POA: Diagnosis not present

## 2024-01-03 DIAGNOSIS — F419 Anxiety disorder, unspecified: Secondary | ICD-10-CM | POA: Diagnosis not present

## 2024-01-03 DIAGNOSIS — R0683 Snoring: Secondary | ICD-10-CM | POA: Diagnosis not present

## 2024-01-07 ENCOUNTER — Other Ambulatory Visit: Payer: Self-pay

## 2024-01-31 DIAGNOSIS — L720 Epidermal cyst: Secondary | ICD-10-CM | POA: Diagnosis not present

## 2024-01-31 DIAGNOSIS — L820 Inflamed seborrheic keratosis: Secondary | ICD-10-CM | POA: Diagnosis not present

## 2024-01-31 DIAGNOSIS — C44319 Basal cell carcinoma of skin of other parts of face: Secondary | ICD-10-CM | POA: Diagnosis not present

## 2024-01-31 DIAGNOSIS — L814 Other melanin hyperpigmentation: Secondary | ICD-10-CM | POA: Diagnosis not present

## 2024-01-31 DIAGNOSIS — L738 Other specified follicular disorders: Secondary | ICD-10-CM | POA: Diagnosis not present

## 2024-01-31 DIAGNOSIS — L570 Actinic keratosis: Secondary | ICD-10-CM | POA: Diagnosis not present

## 2024-01-31 DIAGNOSIS — L821 Other seborrheic keratosis: Secondary | ICD-10-CM | POA: Diagnosis not present

## 2024-01-31 DIAGNOSIS — I8391 Asymptomatic varicose veins of right lower extremity: Secondary | ICD-10-CM | POA: Diagnosis not present

## 2024-01-31 DIAGNOSIS — I8392 Asymptomatic varicose veins of left lower extremity: Secondary | ICD-10-CM | POA: Diagnosis not present

## 2024-02-04 ENCOUNTER — Encounter: Payer: Self-pay | Admitting: Physician Assistant

## 2024-02-04 ENCOUNTER — Ambulatory Visit: Admitting: Physician Assistant

## 2024-02-04 VITALS — BP 102/52 | HR 72 | Ht 64.5 in | Wt 127.0 lb

## 2024-02-04 DIAGNOSIS — R112 Nausea with vomiting, unspecified: Secondary | ICD-10-CM | POA: Diagnosis not present

## 2024-02-04 DIAGNOSIS — Z860101 Personal history of adenomatous and serrated colon polyps: Secondary | ICD-10-CM | POA: Diagnosis not present

## 2024-02-04 DIAGNOSIS — Z860100 Personal history of colon polyps, unspecified: Secondary | ICD-10-CM

## 2024-02-04 MED ORDER — NA SULFATE-K SULFATE-MG SULF 17.5-3.13-1.6 GM/177ML PO SOLN: 1.0000 | Freq: Once | ORAL | 0 refills | Status: AC

## 2024-02-04 MED ORDER — ONDANSETRON HCL 4 MG PO TABS: 4.0000 mg | ORAL_TABLET | ORAL | 0 refills | Status: AC | PRN

## 2024-02-04 NOTE — Progress Notes (Signed)
 Dana Console, PA-C [address] [place], [place]  [address] Phone: [phone]   Gastroenterology Consultation  Referring Provider:     [name], MD Primary Care Physician:  Dana Hamilton, MD Primary Gastroenterologist:  Dana Console, PA-C / Dana Naval, MD  Reason for Consultation:     Discuss repeat colonoscopy        HPI:   Dana Caldwell is a 78 y.o. y/o female referred for consultation & management  by Dana Hamilton, MD.  She is Here to discuss repeat colonoscopy.  She has personal history of adenomatous colon polyps.  No family history of colon cancer.  She denies any GI symptoms.  She is in good health.  No hospitalizations in the past year.  No current blood thinners.  She had nausea and vomiting with her last colonoscopy prep.  12/2016 Last colonoscopy by Dr. Naval: 3 mm tubular adenoma polyp removed from hepatic flexure.  Left-sided diverticulosis.  Internal hemorrhoids.  Good prep.  7 year repeat (due 12/2023).  10/2011 colonoscopy by Dr. Obie: 1 small 5 mm polyp removed from ileocecal valve. 1999 colonoscopy: Normal.  Past Medical History:  Diagnosis Date   Allergic rhinitis    Anxiety    Anxiety disorder    controlled with meds   Arthritis    thumbs   Fibrocystic breast disease    right breast   Hyperlipidemia    NMR 2007: LDL 222(3721/2730), HDL 52,TG 176). LDL goal +  < 115   Migraine headache    Dr Oneita   Skin cancer    Sleep apnea    no c-pap/uses mouth piece    Past Surgical History:  Procedure Laterality Date   BREAST LUMPECTOMY     right   BREAST REDUCTION SURGERY  2008   COLONOSCOPY  1999   Negative ; Dr Obie   COLONOSCOPY W/ POLYPECTOMY  2013   Tubular adenoma;Dr Obie   KNEE SURGERY     right/ torn meniscus   LEG SKIN LESION  BIOPSY / EXCISION Left 12/07/2023   NECK SURGERY  2021   stab phlebectomy  Left 08/21/2017   stab phlebectomy 10-20 incisions left leg by Lynwood Collum MD   TONSILLECTOMY      VAGINAL DELIVERY     X 2   WOUND EXPLORATION Right 05/19/2020   Procedure: RIGHT NECK EXPLORATION;  Surgeon: Lyndel Deward PARAS, MD;  Location: [facility] OR;  Service: General;  Laterality: Right;    Prior to Admission medications   Medication Sig Start Date End Date Taking? Authorizing Provider  ALPRAZolam  (XANAX ) 0.25 MG tablet Takes 0/5 tablet as needed Patient taking differently: Take 0.25 mg by mouth as needed for anxiety. 09/03/13  Yes Tish Elsie FALCON, MD  AMBULATORY NON FORMULARY MEDICATION Untangled supplement 2 capsules by mouth once daily   Yes [provider]  baclofen (LIORESAL) 10 MG tablet Take 10 mg by mouth as needed for muscle spasms.   Yes [provider]  cholecalciferol (VITAMIN D ) 1000 UNITS tablet Take 4,000 Units by mouth daily.   Yes [provider]  diclofenac (VOLTAREN) 50 MG EC tablet Take 50 mg by mouth as needed for moderate pain.  08/19/13  Yes [provider]  escitalopram (LEXAPRO) 20 MG tablet Take 20 mg by mouth daily. 01/03/24  Yes [provider]  ezetimibe -simvastatin  (VYTORIN ) 10-40 MG per tablet take 1 tablet by mouth once daily Patient taking differently: Take 1 tablet by mouth at bedtime. 08/17/14  Yes Tish Elsie FALCON, MD  MINOXIDIL, TOPICAL, (ROGAINE EXTRA STRENGTH) 5 % SOLN Apply 1 Application topically daily.   Yes [provider]  Omega-3 Fatty Acids (FISH OIL) 1000 MG CAPS Take 1,400 mg by mouth daily.   Yes [provider]  OnabotulinumtoxinA (BOTOX IJ) Inject as directed. Botox injection every 3 months.   Yes [provider]  triamcinolone  ointment (KENALOG ) 0.1 % Apply 1 Application topically 2 (two) times daily. Patient taking differently: Apply 1 Application topically as needed. 08/16/23  Yes [provider]  TURMERIC PO Take 1 tablet by mouth 2 (two) times daily.    Yes [provider]  zolpidem (AMBIEN) 5 MG tablet Take 5 mg by mouth at bedtime as needed for  sleep.   Yes [provider]     Family History  Problem Relation Age of Onset   Multiple sclerosis Mother        lived to 25   Stroke Father        > 51   Hypertension Father    Brain cancer Brother    Kidney cancer Brother    Liver cancer Brother    Coronary artery disease Brother        CBAG @ 46   Diabetes Brother    Stroke Brother    Heart attack Brother    Dementia Brother    Congestive Heart Failure Maternal Grandmother    Stroke Maternal Grandfather 55   Breast cancer Paternal Grandmother        Mets   Diabetes Paternal Uncle    Heart attack Paternal Uncle        in 34s   Breast cancer Daughter    Colon cancer Neg Hx    Esophageal cancer Neg Hx    Stomach cancer Neg Hx    Rectal cancer Neg Hx      Social History   Tobacco Use   Smoking status: Never   Smokeless tobacco: Never  Vaping Use   Vaping status: Never Used  Substance Use Topics   Alcohol  use: Yes    Alcohol /week: 1.0 - 2.0 standard drink of alcohol     Types: 1 - 2 Standard drinks or equivalent per week    Comment: social, 1-2 per week   Drug use: No    Allergies as of 02/04/2024 - Review Complete 02/04/2024  Allergen Reaction Noted   Penicillins     Sulfonamide derivatives      Review of Systems:    All systems reviewed and negative except where noted in HPI.   Physical Exam:  BP (!) 102/52 (BP Location: Left Arm, Patient Position: Sitting, Cuff Size: Normal)   Pulse 72   Ht 5' 4.5 (1.638 m) Comment: height measured without shoes  Wt 127 lb (57.6 kg)   BMI 21.46 kg/m  No LMP recorded. Patient is postmenopausal.  General:   Alert,  Well-developed, well-nourished, pleasant and cooperative in NAD Lungs:  Respirations even and unlabored.  Clear throughout to auscultation.   No wheezes, crackles, or rhonchi. No acute distress. Heart:  Regular rate and rhythm; no murmurs, clicks, rubs, or gallops. Abdomen:  Normal bowel sounds.  No bruits.  Soft, and non-distended without  masses, hepatosplenomegaly or hernias noted.  No Tenderness.  No guarding or rebound tenderness.    Neurologic:  Alert and oriented x3;  grossly normal neurologically. Psych:  Alert and cooperative. Normal mood and affect.  Imaging Studies: No results found.  Labs: CBC    Component  Value Date/Time   WBC 8.3 05/21/2020 0037   RBC 3.20 (L) 05/21/2020 0037   HGB 10.3 (L) 05/21/2020 0037   HCT 30.9 (L) 05/21/2020 0037   PLT 174 05/21/2020 0037   MCV 96.6 05/21/2020 0037    CMP     Component Value Date/Time   NA 140 05/21/2020 0037   K 3.4 (L) 05/21/2020 0037   CL 107 05/21/2020 0037   CO2 24 05/21/2020 0037   GLUCOSE 105 (H) 05/21/2020 0037   BUN 11 05/21/2020 0037   CREATININE 0.58 05/21/2020 0037   CALCIUM 8.3 (L) 05/21/2020 0037   PROT 5.9 (L) 05/19/2020 2106   ALBUMIN 3.8 05/19/2020 2106   AST 28 05/19/2020 2106   ALT 19 05/19/2020 2106   ALKPHOS 45 05/19/2020 2106   BILITOT 0.9 05/19/2020 2106   GFRNONAA >60 05/21/2020 0037   GFRAA 93 04/30/2008 0830    Assessment and Plan:   Olayinka Gathers is a 78 y.o. y/o female has been referred for repeat colonoscopy.  She is in excellent health for her age.  She appears to be at acceptable risk for 1 last repeat colonoscopy.  History of Adenomatous Colon Polyps - Scheduling Colonoscopy I discussed risks of colonoscopy with patient to include risk of bleeding, colon perforation, and risk of sedation.  Patient expressed understanding and agrees to proceed with colonoscopy.  - Suprep instructions given. - This will likely be her last surveillance colonoscopy due to advanced age.  2.  Nausea and vomiting with colonoscopy prep - Rx Zofran  4 Mg 1 every 4 hours as needed, #10, no refills.  Follow up as needed based on colonoscopy results.  Dana Console, PA-C

## 2024-02-04 NOTE — Progress Notes (Signed)
 Agree with assessment and plan as outlined.

## 2024-02-04 NOTE — Patient Instructions (Signed)
 You have been scheduled for a Colonoscopy. Please follow written instructions given to you at your visit today.   If you use inhalers (even only as needed), please bring them with you on the day of your procedure.  DO NOT TAKE 7 DAYS PRIOR TO TEST- Trulicity (dulaglutide) Ozempic, Wegovy (semaglutide) Mounjaro (tirzepatide) Bydureon Bcise (exanatide extended release)  DO NOT TAKE 1 DAY PRIOR TO YOUR TEST Rybelsus (semaglutide) Adlyxin (lixisenatide) Victoza (liraglutide) Byetta (exanatide) ___________________________________________________________________________  Please follow up sooner if symptoms increase or worsen   Due to recent changes in healthcare laws, you may see the results of your imaging and laboratory studies on MyChart before your provider has had a chance to review them.  We understand that in some cases there may be results that are confusing or concerning to you. Not all laboratory results come back in the same time frame and the provider may be waiting for multiple results in order to interpret others.  Please give us  48 hours in order for your provider to thoroughly review all the results before contacting the office for clarification of your results.   Thank you for trusting me with your gastrointestinal care!   [name], PA-C _______________________________________________________  If your blood pressure at your visit was 140/90 or greater, please contact your primary care physician to follow up on this.  _______________________________________________________  If you are age 79 or older, your body mass index should be between 23-30. Your Body mass index is 21.46 kg/m. If this is out of the aforementioned range listed, please consider follow up with your Primary Care Provider.  If you are age 86 or younger, your body mass index should be between 19-25. Your Body mass index is 21.46 kg/m. If this is out of the aformentioned range listed, please consider  follow up with your Primary Care Provider.   ________________________________________________________  The [facility] providers would like to encourage you to use MYCHART to communicate with providers for non-urgent requests or questions.  Due to long hold times on the telephone, sending your provider a message by [facility] may be a faster and more efficient way to get a response.  Please allow 48 business hours for a response.  Please remember that this is for non-urgent requests.  _______________________________________________________

## 2024-03-11 ENCOUNTER — Encounter: Payer: Self-pay | Admitting: Gastroenterology

## 2024-03-11 DIAGNOSIS — G4733 Obstructive sleep apnea (adult) (pediatric): Secondary | ICD-10-CM | POA: Diagnosis not present

## 2024-03-11 DIAGNOSIS — H524 Presbyopia: Secondary | ICD-10-CM | POA: Diagnosis not present

## 2024-03-11 DIAGNOSIS — Z961 Presence of intraocular lens: Secondary | ICD-10-CM | POA: Diagnosis not present

## 2024-03-11 DIAGNOSIS — H43813 Vitreous degeneration, bilateral: Secondary | ICD-10-CM | POA: Diagnosis not present

## 2024-03-12 DIAGNOSIS — M791 Myalgia, unspecified site: Secondary | ICD-10-CM | POA: Diagnosis not present

## 2024-03-12 DIAGNOSIS — M542 Cervicalgia: Secondary | ICD-10-CM | POA: Diagnosis not present

## 2024-03-12 DIAGNOSIS — G43719 Chronic migraine without aura, intractable, without status migrainosus: Secondary | ICD-10-CM | POA: Diagnosis not present

## 2024-03-12 DIAGNOSIS — G518 Other disorders of facial nerve: Secondary | ICD-10-CM | POA: Diagnosis not present

## 2024-03-18 ENCOUNTER — Ambulatory Visit: Admitting: Gastroenterology

## 2024-03-18 ENCOUNTER — Encounter: Payer: Self-pay | Admitting: Gastroenterology

## 2024-03-18 VITALS — BP 102/56 | HR 63 | Temp 98.1°F | Resp 14 | Ht 64.0 in | Wt 127.0 lb

## 2024-03-18 DIAGNOSIS — K648 Other hemorrhoids: Secondary | ICD-10-CM

## 2024-03-18 DIAGNOSIS — E785 Hyperlipidemia, unspecified: Secondary | ICD-10-CM | POA: Diagnosis not present

## 2024-03-18 DIAGNOSIS — Z860100 Personal history of colon polyps, unspecified: Secondary | ICD-10-CM

## 2024-03-18 DIAGNOSIS — Z860101 Personal history of adenomatous and serrated colon polyps: Secondary | ICD-10-CM | POA: Diagnosis not present

## 2024-03-18 DIAGNOSIS — Z1211 Encounter for screening for malignant neoplasm of colon: Secondary | ICD-10-CM

## 2024-03-18 DIAGNOSIS — F419 Anxiety disorder, unspecified: Secondary | ICD-10-CM | POA: Diagnosis not present

## 2024-03-18 DIAGNOSIS — K5730 Diverticulosis of large intestine without perforation or abscess without bleeding: Secondary | ICD-10-CM

## 2024-03-18 DIAGNOSIS — Q439 Congenital malformation of intestine, unspecified: Secondary | ICD-10-CM | POA: Diagnosis not present

## 2024-03-18 DIAGNOSIS — G4730 Sleep apnea, unspecified: Secondary | ICD-10-CM | POA: Diagnosis not present

## 2024-03-18 MED ORDER — SODIUM CHLORIDE 0.9 % IV SOLN: 500.0000 mL | Freq: Once | INTRAVENOUS | Status: AC

## 2024-03-18 NOTE — Progress Notes (Signed)
 [facility] History and Physical   Primary Care Physician:  [name], MD   Reason for Procedure:   History of colon polyps  Plan:    colonoscopy     HPI: Dana Caldwell is a 78 y.o. female  here for colonoscopy surveillance - one adenoma removed 12/2016.   Patient denies any bowel symptoms at this time. No family history of colon cancer known. Otherwise feels well without any cardiopulmonary symptoms.   I have discussed risks / benefits of anesthesia and endoscopic procedure with Bartley Macario Mo and they wish to proceed with the exams as outlined today.    Past Medical History:  Diagnosis Date   Allergic rhinitis    Anxiety    Anxiety disorder    controlled with meds   Arthritis    thumbs   Fibrocystic breast disease    right breast   Hyperlipidemia    NMR 2007: LDL 222(3721/2730), HDL 52,TG 176). LDL goal +  < 115   Migraine headache    Dr Oneita   Skin cancer    Sleep apnea    no c-pap/uses mouth piece    Past Surgical History:  Procedure Laterality Date   BREAST LUMPECTOMY     right   BREAST REDUCTION SURGERY  2008   COLONOSCOPY  1999   Negative ; Dr Obie   COLONOSCOPY W/ POLYPECTOMY  2013   Tubular adenoma;Dr Obie   KNEE SURGERY     right/ torn meniscus   LEG SKIN LESION  BIOPSY / EXCISION Left 12/07/2023   NECK SURGERY  2021   stab phlebectomy  Left 08/21/2017   stab phlebectomy 10-20 incisions left leg by Lynwood Collum MD   TONSILLECTOMY     VAGINAL DELIVERY     X 2   WOUND EXPLORATION Right 05/19/2020   Procedure: RIGHT NECK EXPLORATION;  Surgeon: Lyndel Deward PARAS, MD;  Location: [facility] OR;  Service: General;  Laterality: Right;    Prior to Admission medications   Medication Sig Start Date End Date Taking? Authorizing Provider  AMBULATORY NON FORMULARY MEDICATION Untangled supplement 2 capsules by mouth once daily   Yes [provider]  cholecalciferol (VITAMIN D ) 1000 UNITS tablet Take 4,000 Units by mouth daily.    Yes [provider]  escitalopram (LEXAPRO) 20 MG tablet Take 20 mg by mouth daily. 01/03/24  Yes [provider]  MINOXIDIL, TOPICAL, (ROGAINE EXTRA STRENGTH) 5 % SOLN Apply 1 Application topically daily.   Yes [provider]  Omega-3 Fatty Acids (FISH OIL) 1000 MG CAPS Take 1,400 mg by mouth daily.   Yes [provider]  OnabotulinumtoxinA (BOTOX IJ) Inject as directed. Botox injection every 3 months.   Yes [provider]  TURMERIC PO Take 1 tablet by mouth 2 (two) times daily.    Yes [provider]  ALPRAZolam  (XANAX ) 0.25 MG tablet Takes 0/5 tablet as needed Patient taking differently: Take 0.25 mg by mouth as needed for anxiety. 09/03/13   Tish Elsie FALCON, MD  baclofen (LIORESAL) 10 MG tablet Take 10 mg by mouth as needed for muscle spasms.    [provider]  diclofenac (VOLTAREN) 50 MG EC tablet Take 50 mg by mouth as needed for moderate pain.  08/19/13   [provider]  ezetimibe -simvastatin  (VYTORIN ) 10-40 MG per tablet take 1 tablet by mouth once daily Patient taking differently: Take 1 tablet by mouth at bedtime. 08/17/14   Tish Elsie FALCON, MD  ondansetron  (ZOFRAN ) 4 MG tablet  Take 1 tablet (4 mg total) by mouth every 4 (four) hours as needed for nausea or vomiting. 02/04/24   [place], PA-C  triamcinolone  ointment (KENALOG ) 0.1 % Apply 1 Application topically 2 (two) times daily. Patient taking differently: Apply 1 Application topically as needed. 08/16/23   [provider]  zolpidem (AMBIEN) 5 MG tablet Take 5 mg by mouth at bedtime as needed for sleep.    [provider]    Current Outpatient Medications  Medication Sig Dispense Refill   AMBULATORY NON FORMULARY MEDICATION Untangled supplement 2 capsules by mouth once daily     cholecalciferol (VITAMIN D ) 1000 UNITS tablet Take 4,000 Units by mouth daily.     escitalopram (LEXAPRO) 20 MG tablet Take 20 mg by mouth daily.      MINOXIDIL, TOPICAL, (ROGAINE EXTRA STRENGTH) 5 % SOLN Apply 1 Application topically daily.     Omega-3 Fatty Acids (FISH OIL) 1000 MG CAPS Take 1,400 mg by mouth daily.     OnabotulinumtoxinA (BOTOX IJ) Inject as directed. Botox injection every 3 months.     TURMERIC PO Take 1 tablet by mouth 2 (two) times daily.      ALPRAZolam  (XANAX ) 0.25 MG tablet Takes 0/5 tablet as needed (Patient taking differently: Take 0.25 mg by mouth as needed for anxiety.) 30 tablet 0   baclofen (LIORESAL) 10 MG tablet Take 10 mg by mouth as needed for muscle spasms.     diclofenac (VOLTAREN) 50 MG EC tablet Take 50 mg by mouth as needed for moderate pain.      ezetimibe -simvastatin  (VYTORIN ) 10-40 MG per tablet take 1 tablet by mouth once daily (Patient taking differently: Take 1 tablet by mouth at bedtime.) 90 tablet 1   ondansetron  (ZOFRAN ) 4 MG tablet Take 1 tablet (4 mg total) by mouth every 4 (four) hours as needed for nausea or vomiting. 10 tablet 0   triamcinolone  ointment (KENALOG ) 0.1 % Apply 1 Application topically 2 (two) times daily. (Patient taking differently: Apply 1 Application topically as needed.)     zolpidem (AMBIEN) 5 MG tablet Take 5 mg by mouth at bedtime as needed for sleep.     Current Facility-Administered Medications  Medication Dose Route Frequency Provider Last Rate Last Admin   0.9 %  sodium chloride  infusion  500 mL Intravenous Once Aser Nylund, Elspeth SQUIBB, MD        Allergies as of 03/18/2024 - Review Complete 03/18/2024  Allergen Reaction Noted   Penicillins     Sulfonamide derivatives      Family History  Problem Relation Age of Onset   Multiple sclerosis Mother        lived to 10   Stroke Father        > 99   Hypertension Father    Brain cancer Brother    Kidney cancer Brother    Liver cancer Brother    Coronary artery disease Brother        CBAG @ 24   Diabetes Brother    Stroke Brother    Heart attack Brother    Dementia Brother    Congestive Heart Failure  Maternal Grandmother    Stroke Maternal Grandfather 55   Breast cancer Paternal Grandmother        Mets   Diabetes Paternal Uncle    Heart attack Paternal Uncle        in 67s   Breast cancer Daughter    Colon cancer Neg Hx    Esophageal cancer Neg Hx  Stomach cancer Neg Hx    Rectal cancer Neg Hx     Social History   Socioeconomic History   Marital status: Married    Spouse name: Not on file   Number of children: 2   Years of education: Not on file   Highest education level: Not on file  Occupational History   Not on file  Tobacco Use   Smoking status: Never   Smokeless tobacco: Never  Vaping Use   Vaping status: Never Used  Substance and Sexual Activity   Alcohol  use: Yes    Alcohol /week: 1.0 - 2.0 standard drink of alcohol     Types: 1 - 2 Standard drinks or equivalent per week    Comment: social, 1-2 per week   Drug use: No   Sexual activity: Yes  Other Topics Concern   Not on file  Social History Narrative   Not on file   Social Drivers of Health   Financial Resource Strain: Not on file  Food Insecurity: Not on file  Transportation Needs: Not on file  Physical Activity: Not on file  Stress: Not on file  Social Connections: Not on file  Intimate Partner Violence: Not on file    Review of Systems: All other review of systems negative except as mentioned in the HPI.  Physical Exam: Vital signs BP 136/79   Pulse 62   Temp 98.1 F (36.7 C)   Resp 17   Ht 5' 4 (1.626 m)   Wt 127 lb (57.6 kg)   SpO2 100%   BMI 21.80 kg/m   General:   Alert,  Well-developed, pleasant and cooperative in NAD Lungs:  Clear throughout to auscultation.   Heart:  Regular rate and rhythm Abdomen:  Soft, nontender and nondistended.   Neuro/Psych:  Alert and cooperative. Normal mood and affect. A and O x 3  Marcey Naval, MD [facility]

## 2024-03-18 NOTE — Progress Notes (Signed)
 Pt's states no medical or surgical changes since previsit or office visit.

## 2024-03-18 NOTE — Progress Notes (Signed)
 Report given to PACU, vss

## 2024-03-18 NOTE — Patient Instructions (Signed)
 YOU HAD AN ENDOSCOPIC PROCEDURE TODAY AT THE [facility]:   Refer to the procedure report that was given to you for any specific questions about what was found during the examination.  If the procedure report does not answer your questions, please call your gastroenterologist to clarify.  If you requested that your care partner not be given the details of your procedure findings, then the procedure report has been included in a sealed envelope for you to review at your convenience later.  YOU SHOULD EXPECT: Some feelings of bloating in the abdomen. Passage of more gas than usual.  Walking can help get rid of the air that was put into your GI tract during the procedure and reduce the bloating. If you had a lower endoscopy (such as a colonoscopy or flexible sigmoidoscopy) you may notice spotting of blood in your stool or on the toilet paper. If you underwent a bowel prep for your procedure, you may not have a normal bowel movement for a few days.  Please Note:  You might notice some irritation and congestion in your nose or some drainage.  This is from the oxygen used during your procedure.  There is no need for concern and it should clear up in a day or so.  SYMPTOMS TO REPORT IMMEDIATELY:  Following lower endoscopy (colonoscopy or flexible sigmoidoscopy):  Excessive amounts of blood in the stool  Significant tenderness or worsening of abdominal pains  Swelling of the abdomen that is new, acute  Fever of 100F or higher   For urgent or emergent issues, a gastroenterologist can be reached at any hour by calling [phone]. Do not use MyChart messaging for urgent concerns.    DIET:  We do recommend a small meal at first, but then you may proceed to your regular diet.  Drink plenty of fluids but you should avoid alcoholic beverages for 24 hours.  MEDICATIONS: Continue present medications.  FOLLOW UP: No further routine surveillance recommended given no polyps on this exam and the  patient's age (would not be due for another in 10 years, at age 84).  Handouts given to patient: Diverticulosis, Hemorrhoids.  Thank you for allowing us  to provide for your healthcare needs today.  ACTIVITY:  You should plan to take it easy for the rest of today and you should NOT DRIVE or use heavy machinery until tomorrow (because of the sedation medicines used during the test).    FOLLOW UP: Our staff will call the number listed on your records the next business day following your procedure.  We will call around 7:15- 8:00 am to check on you and address any questions or concerns that you may have regarding the information given to you following your procedure. If we do not reach you, we will leave a message.     If any biopsies were taken you will be contacted by phone or by letter within the next 1-3 weeks.  Please call us  at [phone] if you have not heard about the biopsies in 3 weeks.    SIGNATURES/CONFIDENTIALITY: You and/or your care partner have signed paperwork which will be entered into your electronic medical record.  These signatures attest to the fact that that the information above on your After Visit Summary has been reviewed and is understood.  Full responsibility of the confidentiality of this discharge information lies with you and/or your care-partner.

## 2024-03-18 NOTE — Op Note (Signed)
 [facility] Patient Name: Dana Caldwell Procedure Date: 03/18/2024 8:23 AM MRN: [id number] Endoscopist: Elspeth P. Leigh , MD, [id number] Age: 78 Referring MD:  Date of Birth: 31-Aug-1945 Gender: Female Account #: [id number] Procedure:                Colonoscopy Indications:              High risk colon cancer surveillance: Personal                            history of colonic polyps - adenoma removed 12/2016 Medicines:                Monitored Anesthesia Care Procedure:                Pre-Anesthesia Assessment:                           - Prior to the procedure, a History and Physical                            was performed, and patient medications and                            allergies were reviewed. The patient's tolerance of                            previous anesthesia was also reviewed. The risks                            and benefits of the procedure and the sedation                            options and risks were discussed with the patient.                            All questions were answered, and informed consent                            was obtained. Prior Anticoagulants: The patient has                            taken no anticoagulant or antiplatelet agents. ASA                            Grade Assessment: II - A patient with mild systemic                            disease. After reviewing the risks and benefits,                            the patient was deemed in satisfactory condition to                            undergo the procedure.  After obtaining informed consent, the colonoscope                            was passed under direct vision. Throughout the                            procedure, the patient's blood pressure, pulse, and                            oxygen saturations were monitored continuously. The                            Olympus Scope SN [id number] was introduced through the                            anus  and advanced to the the cecum, identified by                            appendiceal orifice and ileocecal valve. The                            colonoscopy was performed without difficulty. The                            patient tolerated the procedure well. The quality                            of the bowel preparation was adequate. The                            ileocecal valve, appendiceal orifice, and rectum                            were photographed. Scope In: 8:47:50 AM Scope Out: 9:05:47 AM Scope Withdrawal Time: 0 hours 14 minutes 6 seconds  Total Procedure Duration: 0 hours 17 minutes 57 seconds  Findings:                 The perianal and digital rectal examinations were                            normal.                           A few small-mouthed diverticula were found in the                            transverse colon and left colon.                           The colon was tortuous.                           Internal hemorrhoids were found.  The exam was otherwise without abnormality. Complications:            No immediate complications. Estimated blood loss:                            None. Estimated Blood Loss:     Estimated blood loss: none. Impression:               - Diverticulosis in the transverse colon and in the                            left colon.                           - Tortuous colon.                           - Internal hemorrhoids.                           - The examination was otherwise normal.                           - No polyps Recommendation:           - Patient has a contact number available for                            emergencies. The signs and symptoms of potential                            delayed complications were discussed with the                            patient. Return to normal activities tomorrow.                            Written discharge instructions were provided to the                             patient.                           - Resume previous diet.                           - Continue present medications.                           - No further surveillance recommended given no                            polps on this exam and the patient's age (would not                            be due for another 10 years, at age 29) Elspeth SQUIBB. Leigh, MD 03/18/2024 9:09:40 AM This report has been signed electronically.

## 2024-03-19 ENCOUNTER — Telehealth: Payer: Self-pay

## 2024-03-19 NOTE — Telephone Encounter (Signed)
 Left message on follow up call.

## 2024-04-07 DIAGNOSIS — E785 Hyperlipidemia, unspecified: Secondary | ICD-10-CM | POA: Diagnosis not present

## 2024-04-07 DIAGNOSIS — Z0189 Encounter for other specified special examinations: Secondary | ICD-10-CM | POA: Diagnosis not present

## 2024-04-07 DIAGNOSIS — E559 Vitamin D deficiency, unspecified: Secondary | ICD-10-CM | POA: Diagnosis not present

## 2024-04-08 DIAGNOSIS — Z23 Encounter for immunization: Secondary | ICD-10-CM | POA: Diagnosis not present

## 2024-04-11 DIAGNOSIS — Z23 Encounter for immunization: Secondary | ICD-10-CM | POA: Diagnosis not present

## 2024-04-11 DIAGNOSIS — E785 Hyperlipidemia, unspecified: Secondary | ICD-10-CM | POA: Diagnosis not present

## 2024-04-11 DIAGNOSIS — Z1331 Encounter for screening for depression: Secondary | ICD-10-CM | POA: Diagnosis not present

## 2024-04-11 DIAGNOSIS — E559 Vitamin D deficiency, unspecified: Secondary | ICD-10-CM | POA: Diagnosis not present

## 2024-04-11 DIAGNOSIS — Z636 Dependent relative needing care at home: Secondary | ICD-10-CM | POA: Diagnosis not present

## 2024-04-11 DIAGNOSIS — H43813 Vitreous degeneration, bilateral: Secondary | ICD-10-CM | POA: Diagnosis not present

## 2024-04-11 DIAGNOSIS — Z0000 Encounter for general adult medical examination without abnormal findings: Secondary | ICD-10-CM | POA: Diagnosis not present

## 2024-04-11 DIAGNOSIS — G2581 Restless legs syndrome: Secondary | ICD-10-CM | POA: Diagnosis not present

## 2024-04-11 DIAGNOSIS — G4730 Sleep apnea, unspecified: Secondary | ICD-10-CM | POA: Diagnosis not present

## 2024-04-11 DIAGNOSIS — Z1339 Encounter for screening examination for other mental health and behavioral disorders: Secondary | ICD-10-CM | POA: Diagnosis not present

## 2024-04-11 DIAGNOSIS — M8588 Other specified disorders of bone density and structure, other site: Secondary | ICD-10-CM | POA: Diagnosis not present

## 2024-04-11 DIAGNOSIS — H6121 Impacted cerumen, right ear: Secondary | ICD-10-CM | POA: Diagnosis not present

## 2024-04-11 DIAGNOSIS — Z961 Presence of intraocular lens: Secondary | ICD-10-CM | POA: Diagnosis not present

## 2024-04-11 DIAGNOSIS — G4700 Insomnia, unspecified: Secondary | ICD-10-CM | POA: Diagnosis not present

## 2024-04-11 DIAGNOSIS — F419 Anxiety disorder, unspecified: Secondary | ICD-10-CM | POA: Diagnosis not present

## 2024-04-14 DIAGNOSIS — C44319 Basal cell carcinoma of skin of other parts of face: Secondary | ICD-10-CM | POA: Diagnosis not present

## 2024-04-14 DIAGNOSIS — Z85828 Personal history of other malignant neoplasm of skin: Secondary | ICD-10-CM | POA: Diagnosis not present

## 2024-06-03 DIAGNOSIS — F411 Generalized anxiety disorder: Secondary | ICD-10-CM | POA: Diagnosis not present
